# Patient Record
Sex: Female | Born: 1965 | Race: White | Hispanic: Yes | Marital: Married | Smoking: Never smoker
Health system: Southern US, Community
[De-identification: ages and names within clinical notes are randomized; demographics above are authoritative.]

## PROBLEM LIST (undated history)

## (undated) DIAGNOSIS — D649 Anemia, unspecified: Secondary | ICD-10-CM

## (undated) HISTORY — DX: Anemia, unspecified: D64.9

## (undated) HISTORY — PX: EYE SURGERY: SHX253

---

## 2014-02-26 ENCOUNTER — Ambulatory Visit (INDEPENDENT_AMBULATORY_CARE_PROVIDER_SITE_OTHER): Payer: BC Managed Care – PPO | Admitting: Family

## 2014-02-26 ENCOUNTER — Other Ambulatory Visit: Payer: Self-pay | Admitting: Family

## 2014-02-26 ENCOUNTER — Other Ambulatory Visit (HOSPITAL_COMMUNITY)
Admission: RE | Admit: 2014-02-26 | Discharge: 2014-02-26 | Disposition: A | Discharge status: Home / Self Care | Payer: BC Managed Care – PPO | Source: Ambulatory Visit | Attending: Family | Admitting: Family

## 2014-02-26 ENCOUNTER — Encounter (INDEPENDENT_AMBULATORY_CARE_PROVIDER_SITE_OTHER): Payer: Self-pay

## 2014-02-26 ENCOUNTER — Encounter: Payer: Self-pay | Admitting: Family

## 2014-02-26 VITALS — BP 100/78 | HR 81 | Temp 98.4°F | Resp 16 | Ht 61.25 in | Wt 121.1 lb

## 2014-02-26 DIAGNOSIS — Z87898 Personal history of other specified conditions: Secondary | ICD-10-CM

## 2014-02-26 DIAGNOSIS — M21619 Bunion of unspecified foot: Secondary | ICD-10-CM

## 2014-02-26 DIAGNOSIS — E28319 Asymptomatic premature menopause: Secondary | ICD-10-CM

## 2014-02-26 DIAGNOSIS — Z1151 Encounter for screening for human papillomavirus (HPV): Secondary | ICD-10-CM | POA: Insufficient documentation

## 2014-02-26 DIAGNOSIS — Z9189 Other specified personal risk factors, not elsewhere classified: Secondary | ICD-10-CM

## 2014-02-26 DIAGNOSIS — N63 Unspecified lump in unspecified breast: Secondary | ICD-10-CM

## 2014-02-26 DIAGNOSIS — M21612 Bunion of left foot: Secondary | ICD-10-CM

## 2014-02-26 DIAGNOSIS — Z01419 Encounter for gynecological examination (general) (routine) without abnormal findings: Secondary | ICD-10-CM | POA: Insufficient documentation

## 2014-02-26 DIAGNOSIS — M170 Bilateral primary osteoarthritis of knee: Secondary | ICD-10-CM | POA: Insufficient documentation

## 2014-02-26 DIAGNOSIS — M1710 Unilateral primary osteoarthritis, unspecified knee: Secondary | ICD-10-CM

## 2014-02-26 DIAGNOSIS — L989 Disorder of the skin and subcutaneous tissue, unspecified: Secondary | ICD-10-CM

## 2014-02-26 DIAGNOSIS — M21611 Bunion of right foot: Secondary | ICD-10-CM | POA: Insufficient documentation

## 2014-02-26 DIAGNOSIS — IMO0002 Reserved for concepts with insufficient information to code with codable children: Secondary | ICD-10-CM

## 2014-02-26 DIAGNOSIS — Z0000 Encounter for general adult medical examination without abnormal findings: Secondary | ICD-10-CM

## 2014-02-26 LAB — CBC WITH DIFFERENTIAL/PLATELET
Basophils Absolute: 0 10*3/uL (ref 0.0–0.1)
Basophils Relative: 0 % (ref 0–1)
EOS ABS: 0.1 10*3/uL (ref 0.0–0.7)
EOS PCT: 2 % (ref 0–5)
HEMATOCRIT: 41.9 % (ref 36.0–46.0)
Hemoglobin: 13.9 g/dL (ref 12.0–15.0)
LYMPHS ABS: 1.2 10*3/uL (ref 0.7–4.0)
LYMPHS PCT: 24 % (ref 12–46)
MCH: 29.1 pg (ref 26.0–34.0)
MCHC: 33.2 g/dL (ref 30.0–36.0)
MCV: 87.8 fL (ref 78.0–100.0)
MONO ABS: 0.3 10*3/uL (ref 0.1–1.0)
MONOS PCT: 6 % (ref 3–12)
Neutro Abs: 3.3 10*3/uL (ref 1.7–7.7)
Neutrophils Relative %: 68 % (ref 43–77)
Platelets: 210 10*3/uL (ref 150–400)
RBC: 4.77 MIL/uL (ref 3.87–5.11)
RDW: 13.1 % (ref 11.5–15.5)
WBC: 4.8 10*3/uL (ref 4.0–10.5)

## 2014-02-26 LAB — LIPID PANEL
Cholesterol: 155 mg/dL (ref 0–200)
HDL: 60 mg/dL (ref >39)
LDL Cholesterol: 72 mg/dL (ref 0–99)
Total CHOL/HDL Ratio: 2.6 Ratio
Triglycerides: 115 mg/dL (ref <150)
VLDL: 23 mg/dL (ref 0–40)

## 2014-02-26 LAB — BASIC METABOLIC PANEL WITH GFR
BUN: 12 mg/dL (ref 6–23)
CALCIUM: 9.6 mg/dL (ref 8.4–10.5)
CO2: 32 meq/L (ref 19–32)
Chloride: 104 mEq/L (ref 96–112)
Creat: 0.63 mg/dL (ref 0.50–1.10)
GFR, Est African American: >89 mL/min
GFR, Est Non African American: >89 mL/min
GLUCOSE: 88 mg/dL (ref 70–99)
Potassium: 5.2 mEq/L (ref 3.5–5.3)
SODIUM: 141 meq/L (ref 135–145)

## 2014-02-26 LAB — HEPATIC FUNCTION PANEL
ALBUMIN: 4.3 g/dL (ref 3.5–5.2)
ALK PHOS: 93 U/L (ref 39–117)
ALT: 27 U/L (ref 0–35)
AST: 30 U/L (ref 0–37)
BILIRUBIN INDIRECT: 0.3 mg/dL (ref 0.2–1.2)
Bilirubin, Direct: 0.1 mg/dL (ref 0.0–0.3)
TOTAL PROTEIN: 7.2 g/dL (ref 6.0–8.3)
Total Bilirubin: 0.4 mg/dL (ref 0.2–1.2)

## 2014-02-26 LAB — TSH: TSH: 1.7 u[IU]/mL (ref 0.350–4.500)

## 2014-02-26 NOTE — Assessment & Plan Note (Signed)
Recommended prn tylenol, regular exercise.

## 2014-02-26 NOTE — Assessment & Plan Note (Signed)
She would like to be referred to podiatry for possible surgical correction.

## 2014-02-26 NOTE — Progress Notes (Signed)
Pre visit review using our clinic review tool, if applicable. No additional management support is needed unless otherwise documented below in the visit note. 

## 2014-02-26 NOTE — Patient Instructions (Signed)
Please complete your lab work prior to leaving. Schedule bone density at the front desk. You will be contacted re: mammogram, podiatry and dermatology referrals. Follow up in 1 year, sooner if problems/concerns.

## 2014-02-26 NOTE — Assessment & Plan Note (Signed)
Will refer to dermatology for excision.  

## 2014-02-26 NOTE — Assessment & Plan Note (Signed)
No palpable mass today, but she never followed through with the 6 month follow up breast US back in 2012. Will refer for diagnostic mammo and breast US.

## 2014-02-26 NOTE — Assessment & Plan Note (Signed)
Will add caltrate for bone health, obtain vitamin D level.

## 2014-02-26 NOTE — Assessment & Plan Note (Addendum)
Discussed healthy diet, exercise.  Tdap up to date. Pap performed today with chaperone. Obtain fasting lab work.  Obtain dexa scan due to hx early menopause.

## 2014-02-26 NOTE — Progress Notes (Signed)
Subjective:    Patient ID: Diana Norris, female    DOB: 05/15/1966, 48 y.o.   MRN: [id number]  HPI  Diana Norris is a 48 yr old female who presents today to establish care.    Immunizations: tetanus up to date, declines flu shot.  Diet: Working on a healthier diet Exercise: dancing, roller skating Pap Smear:2/12 Mammogram: due- Abnormal breast ultrasound- had breast ultrasound 2012 and it was recommended that it be repeated in 6 months. She never completed follow up breast ultrasound.   Anemia- hx of reports LMP 7/11  R knee pain- some aching both knees at night.  Worse with sitting or bending.    Reports occasional discomfort right buttock, cramping pain.    Skin lesion right cheek and left upper arm  [place]- would like referral to podiatry   Review of Systems  Constitutional: Negative for unexpected weight change.  HENT: Negative for hearing loss.   Eyes:       Wears glasses for driving  Respiratory: Negative for cough.   Cardiovascular: Negative for leg swelling.  Gastrointestinal: Negative for vomiting and diarrhea.       Occasional nausea which she attributes to menopause  Genitourinary: Negative for dysuria and frequency.  Musculoskeletal:       Bilateral knee pain  Skin: Negative for rash.   Past Medical History  Diagnosis Date  . Anemia     History   Social History  . Marital Status: Married    Spouse Name: N/A    Number of Children: N/A  . Years of Education: N/A   Occupational History  . Not on file.   Social History Main Topics  . Smoking status: Never Smoker   . Smokeless tobacco: Never Used  . Alcohol Use: No  . Drug Use: Not on file  . Sexual Activity: Not on file   Other Topics Concern  . Not on file   Social History Narrative   Married   Has bachelor degree, works in Press photographer.     3 children 74 son, 45 and 61 daughters   Enjoys dancing, spending time with kids    Past Surgical History  Procedure Laterality Date  . Cesarean  section      x 2    Family History  Problem Relation Age of Onset  . Hypertension Mother   . Hypertension Father   . Diabetes Father   . Hyperlipidemia Father   . Cancer Maternal Grandmother     breast?  . Heart disease Neg Hx   . Kidney disease Neg Hx     No Known Allergies  No current outpatient prescriptions on file prior to visit.   No current facility-administered medications on file prior to visit.    BP 100/78  Pulse 81  Temp(Src) 98.4 F (36.9 C) (Oral)  Resp 16  Ht 5' 1.25" (1.556 m)  Wt 121 lb 1.9 oz (54.94 kg)  BMI 22.69 kg/m2  SpO2 99%  LMP 06/26/2010       Objective:   Physical Exam  Physical Exam  Constitutional: She is oriented to person, place, and time. She appears well-developed and well-nourished. No distress.  HENT:  Head: Normocephalic and atraumatic.  Right Ear: Tympanic membrane and ear canal normal.  Left Ear: Tympanic membrane and ear canal normal.  Mouth/Throat: Oropharynx is clear and moist.  Eyes: Pupils are equal, round, and reactive to light. No scleral icterus.  Neck: Normal range of motion. No thyromegaly present.  Cardiovascular: Normal rate and  regular rhythm.   No murmur heard. Pulmonary/Chest: Effort normal and breath sounds normal. No respiratory distress. He has no wheezes. She has no rales. She exhibits no tenderness.  Abdominal: Soft. Bowel sounds are normal. He exhibits no distension and no mass. There is no tenderness. There is no rebound and no guarding.  Musculoskeletal: She exhibits no edema. no knee pain or crepitus is noted Lymphadenopathy:    She has no cervical adenopathy.  Neurological: She is alert and oriented to person, place, and time. She has normal reflexes. She exhibits normal muscle tone. Coordination normal.  Skin: Skin is warm and dry. small, cutaneous nodule noted left upper arm and left forearm Psychiatric: She has a normal mood and affect. Her behavior is normal. Judgment and thought content  normal.  Breasts: Examined lying Right: Without masses, retractions, discharge or axillary adenopathy.  Left: Without masses, retractions, discharge or axillary adenopathy.  Inguinal/mons: Normal without inguinal adenopathy  External genitalia: Normal  BUS/Urethra/Skene's glands: Normal  Bladder: Normal  Vagina: Normal  Cervix: Normal  Uterus: normal in size, shape and contour. Midline and mobile  Adnexa/parametria:  Rt: Without masses or tenderness.  Lt: Without masses or tenderness.  Anus and perineum: Normal           Assessment & Plan:          Assessment & Plan:

## 2014-02-27 ENCOUNTER — Telehealth: Payer: Self-pay | Admitting: Family

## 2014-02-27 DIAGNOSIS — E559 Vitamin D deficiency, unspecified: Secondary | ICD-10-CM

## 2014-02-27 LAB — URINALYSIS, ROUTINE W REFLEX MICROSCOPIC
Bilirubin Urine: NEGATIVE
Glucose, UA: NEGATIVE mg/dL
Hgb urine dipstick: NEGATIVE
Ketones, ur: NEGATIVE mg/dL
LEUKOCYTES UA: NEGATIVE
Nitrite: NEGATIVE
Protein, ur: NEGATIVE mg/dL
SPECIFIC GRAVITY, URINE: 1.018 (ref 1.005–1.030)
UROBILINOGEN UA: 0.2 mg/dL (ref 0.0–1.0)
pH: 7 (ref 5.0–8.0)

## 2014-02-27 LAB — VITAMIN D 25 HYDROXY (VIT D DEFICIENCY, FRACTURES): VIT D 25 HYDROXY: 16 ng/mL — AB (ref 30–89)

## 2014-02-27 MED ORDER — VITAMIN D (ERGOCALCIFEROL) 1.25 MG (50000 UNIT) PO CAPS: 50000.0000 [IU] | ORAL_CAPSULE | ORAL | Status: DC

## 2014-02-27 NOTE — Telephone Encounter (Signed)
Please call pt and let her know that her blood work all looks good except for vitamin D level which is very low.  I would like her to start the caltrate with D as we discussed at her visit, and also to start vit D 50000 units once weekly x 12 weeks, then recheck level.

## 2014-02-27 NOTE — Telephone Encounter (Signed)
Pt is wanting to know if her estrogen was checked with lab work

## 2014-02-27 NOTE — Telephone Encounter (Signed)
Notified pt that we did not check estrogen level. She requests to have this added to previous labs due to her history of amenorrhea x 4 years.  PLease advise if ok to add test?

## 2014-02-27 NOTE — Telephone Encounter (Signed)
Notified pt and she voices understanding. Lab order entered and copy mailed to pt as a reminder.

## 2014-02-28 NOTE — Telephone Encounter (Signed)
OK to add test, but I expect this to be low as she is menopausal. Please let pt know that the results will not affect our management as she does not wish to do estrogen replacement.  I am happy to check though if she would like to know her level.

## 2014-03-01 NOTE — Telephone Encounter (Signed)
Test has been added per [facility]. 

## 2014-03-02 ENCOUNTER — Encounter: Payer: Self-pay | Admitting: Family

## 2014-03-04 LAB — ESTROGENS, TOTAL: ESTROGEN: 73.6 pg/mL

## 2014-03-06 ENCOUNTER — Telehealth: Payer: Self-pay | Admitting: *Deleted

## 2014-03-06 DIAGNOSIS — N912 Amenorrhea, unspecified: Secondary | ICD-10-CM

## 2014-03-06 NOTE — Telephone Encounter (Signed)
Notified pt and she is agreeable to proceed with additional tests.  Lab order entered.

## 2014-03-06 NOTE — Telephone Encounter (Signed)
Message copied by Ronny Flurry on Wed Mar 06, 2014  4:40 PM ------      Message from: O'SULLIVAN, MELISSA      Created: Tue Mar 05, 2014  7:43 AM       Please let pt know that her estrogen level actually looks good and is not in the menopausal range.  I would like her to complete [facility] and [facility] to further evaluate her menopausal status. Dx amenorrhea. ------

## 2014-03-11 ENCOUNTER — Other Ambulatory Visit: Payer: BC Managed Care – PPO

## 2014-03-12 ENCOUNTER — Ambulatory Visit
Admission: RE | Admit: 2014-03-12 | Discharge: 2014-03-12 | Disposition: A | Discharge status: Home / Self Care | Payer: BC Managed Care – PPO | Source: Ambulatory Visit | Attending: Family | Admitting: Family

## 2014-03-12 DIAGNOSIS — N63 Unspecified lump in unspecified breast: Secondary | ICD-10-CM

## 2015-02-27 ENCOUNTER — Telehealth: Payer: Self-pay

## 2015-02-27 NOTE — Telephone Encounter (Signed)
Medication: Review, verify sig & reconcile(including outside meds):  yes Duplicates discarded: n/a DM supply source: n/a  Preferred Pharmacy and which med where:  [id number] - [place], [place] - [address] AT [place] 90 day supply/mail order: n/a Local pharmacy: [id number] - [place], [place] - [address] AT [place]   Allergies verified: yes  Immunization Status: Prompted for insurance verification: n/a  Flu vaccine-- declined Tdap--08/17/11  A/P:   Changes to [place], PSH or Personal Hx: Reviewed. No changes. Pap--02/26/14- normal MMG-- 03/12/14- benign findings  Care Teams Updated: ED/Hospital/Urgent Care Visits: No Prompted for: Updated insurance, contact information, forms:  yes Remind to bring: DPR information, advance directives:  yes  To Discuss with Provider: Nothing at this time.

## 2015-03-03 ENCOUNTER — Encounter: Payer: Self-pay | Admitting: Family

## 2015-03-03 ENCOUNTER — Ambulatory Visit (INDEPENDENT_AMBULATORY_CARE_PROVIDER_SITE_OTHER): Payer: BLUE CROSS/BLUE SHIELD | Admitting: Family

## 2015-03-03 VITALS — BP 86/66 | HR 71 | Temp 98.0°F | Resp 16 | Ht 61.25 in | Wt 123.6 lb

## 2015-03-03 DIAGNOSIS — E559 Vitamin D deficiency, unspecified: Secondary | ICD-10-CM

## 2015-03-03 DIAGNOSIS — Z0000 Encounter for general adult medical examination without abnormal findings: Secondary | ICD-10-CM

## 2015-03-03 DIAGNOSIS — L989 Disorder of the skin and subcutaneous tissue, unspecified: Secondary | ICD-10-CM

## 2015-03-03 LAB — HEPATIC FUNCTION PANEL
ALBUMIN: 4.3 g/dL (ref 3.5–5.2)
ALT: 19 U/L (ref 0–35)
AST: 20 U/L (ref 0–37)
Alkaline Phosphatase: 102 U/L (ref 39–117)
BILIRUBIN DIRECT: 0.1 mg/dL (ref 0.0–0.3)
TOTAL PROTEIN: 7.5 g/dL (ref 6.0–8.3)
Total Bilirubin: 0.5 mg/dL (ref 0.2–1.2)

## 2015-03-03 LAB — LIPID PANEL
CHOL/HDL RATIO: 2
CHOLESTEROL: 143 mg/dL (ref 0–200)
HDL: 58.4 mg/dL (ref >39.00)
LDL CALC: 59 mg/dL (ref 0–99)
NonHDL: 84.6
Triglycerides: 128 mg/dL (ref 0.0–149.0)
VLDL: 25.6 mg/dL (ref 0.0–40.0)

## 2015-03-03 LAB — CBC WITH DIFFERENTIAL/PLATELET
BASOS ABS: 0 10*3/uL (ref 0.0–0.1)
Basophils Relative: 0.7 % (ref 0.0–3.0)
Eosinophils Absolute: 0.1 10*3/uL (ref 0.0–0.7)
Eosinophils Relative: 1.4 % (ref 0.0–5.0)
HCT: 40.7 % (ref 36.0–46.0)
Hemoglobin: 13.6 g/dL (ref 12.0–15.0)
Lymphocytes Relative: 22.3 % (ref 12.0–46.0)
Lymphs Abs: 1 10*3/uL (ref 0.7–4.0)
MCHC: 33.4 g/dL (ref 30.0–36.0)
MCV: 87.3 fl (ref 78.0–100.0)
MONO ABS: 0.3 10*3/uL (ref 0.1–1.0)
Monocytes Relative: 5.9 % (ref 3.0–12.0)
Neutro Abs: 3.1 10*3/uL (ref 1.4–7.7)
Neutrophils Relative %: 69.7 % (ref 43.0–77.0)
PLATELETS: 228 10*3/uL (ref 150.0–400.0)
RBC: 4.66 Mil/uL (ref 3.87–5.11)
RDW: 12.7 % (ref 11.5–15.5)
WBC: 4.5 10*3/uL (ref 4.0–10.5)

## 2015-03-03 LAB — BASIC METABOLIC PANEL
BUN: 11 mg/dL (ref 6–23)
CHLORIDE: 104 meq/L (ref 96–112)
CO2: 31 meq/L (ref 19–32)
Calcium: 9.5 mg/dL (ref 8.4–10.5)
Creatinine, Ser: 0.66 mg/dL (ref 0.40–1.20)
GFR: 101.7 mL/min (ref >60.00)
Glucose, Bld: 87 mg/dL (ref 70–99)
Potassium: 4.6 mEq/L (ref 3.5–5.1)
Sodium: 139 mEq/L (ref 135–145)

## 2015-03-03 LAB — URINALYSIS, ROUTINE W REFLEX MICROSCOPIC
Bilirubin Urine: NEGATIVE
Hgb urine dipstick: NEGATIVE
Ketones, ur: NEGATIVE
LEUKOCYTES UA: NEGATIVE
Nitrite: NEGATIVE
PH: 7 (ref 5.0–8.0)
SPECIFIC GRAVITY, URINE: 1.015 (ref 1.000–1.030)
Total Protein, Urine: NEGATIVE
URINE GLUCOSE: NEGATIVE
UROBILINOGEN UA: 0.2 (ref 0.0–1.0)

## 2015-03-03 LAB — TSH: TSH: 1.43 u[IU]/mL (ref 0.35–4.50)

## 2015-03-03 LAB — VITAMIN D 25 HYDROXY (VIT D DEFICIENCY, FRACTURES): VITD: 12.72 ng/mL — ABNORMAL LOW (ref 30.00–100.00)

## 2015-03-03 NOTE — Patient Instructions (Addendum)
Please complete lab work prior to leaving. You will be contacted about scheduling your mammogram.  Please check your records re: bone density and let us know when/where this was performed.  Please follow up in 1 year.  Follow up sooner if problems or concerns.

## 2015-03-03 NOTE — Progress Notes (Signed)
Subjective:    Patient ID: Diana Norris, female    DOB: 08/25/1966, 49 y.o.   MRN: [id number]  HPI   Diana Norris is a 49 yr old female who presents today for cpx.  Patient presents today for complete physical.  Immunizations: tdap up to date Diet: healthy Exercise: was walking.  Pap Smear: negative last year. Mammogram: due     Review of Systems  Constitutional: Negative for fever, fatigue and unexpected weight change.  HENT: Positive for rhinorrhea. Negative for hearing loss.   Eyes: Negative for visual disturbance.  Respiratory: Negative for cough.   Cardiovascular: Negative for palpitations and leg swelling.  Gastrointestinal: Negative for nausea, abdominal pain, diarrhea and constipation.  Genitourinary: Negative for dysuria and frequency.  Musculoskeletal: Negative for myalgias and arthralgias.       Some musculoskeletal pain following cough, occasional left knee pain  Skin: Negative for rash.       Nodule left elbow, skin tag right ear  Neurological: Negative for headaches.       Some eye strain due to being on computer  Hematological: Negative for adenopathy.  Psychiatric/Behavioral: Negative for dysphoric mood and agitation.   Past Medical History  Diagnosis Date  . Anemia     History   Social History  . Marital Status: Married    Spouse Name: N/A  . Number of Children: N/A  . Years of Education: N/A   Occupational History  . Not on file.   Social History Main Topics  . Smoking status: Never Smoker   . Smokeless tobacco: Never Used  . Alcohol Use: No  . Drug Use: Not on file  . Sexual Activity: Not on file   Other Topics Concern  . Not on file   Social History Narrative   Married   Has bachelor degree, works in Press photographer.     3 children 64 son, 81 and 5 daughters   Enjoys dancing, spending time with kids    Past Surgical History  Procedure Laterality Date  . Cesarean section      x 2    Family History  Problem Relation Age of  Onset  . Hypertension Mother   . Hypertension Father   . Diabetes Father   . Hyperlipidemia Father   . Cancer Maternal Grandmother     breast?  . Heart disease Neg Hx   . Kidney disease Neg Hx     No Known Allergies  Current Outpatient Prescriptions on File Prior to Visit  Medication Sig Dispense Refill  . Calcium Carbonate-Vitamin D (CALTRATE 600+D) 600-400 MG-UNIT per tablet Take 2 tablets by mouth daily.     No current facility-administered medications on file prior to visit.    BP 86/66 mmHg  Pulse 71  Temp(Src) 98 F (36.7 C) (Oral)  Resp 16  Ht 5' 1.25" (1.556 m)  Wt 123 lb 9.6 oz (56.065 kg)  BMI 23.16 kg/m2  SpO2 99%  LMP 06/26/2010       Objective:   Physical Exam  Physical Exam  Constitutional: She is oriented to person, place, and time. She appears well-developed and well-nourished. No distress.  HENT:  Head: Normocephalic and atraumatic.  Right Ear: Tympanic membrane and ear canal normal.  Left Ear: Tympanic membrane and ear canal normal.  Mouth/Throat: Oropharynx is clear and moist.  Eyes: Pupils are equal, round, and reactive to light. No scleral icterus.  Neck: Normal range of motion. No thyromegaly present.  Cardiovascular: Normal rate and regular rhythm.  No murmur heard. Pulmonary/Chest: Effort normal and breath sounds normal. No respiratory distress. He has no wheezes. She has no rales. She exhibits no tenderness.  Abdominal: Soft. Bowel sounds are normal. He exhibits no distension and no mass. There is no tenderness. There is no rebound and no guarding.  Musculoskeletal: She exhibits no edema.  Lymphadenopathy:    She has no cervical adenopathy.  Neurological: She is alert and oriented to person, place, and time. She has normal patellar reflexes. She exhibits normal muscle tone. Coordination normal.  Skin: Skin is warm and dry. Right pre-auricular skin lesion and nodule left upper inner arm.  Psychiatric: She has a normal mood and affect.  Her behavior is normal. Judgment and thought content normal.  Breasts: Examined lying Right: Without masses, discharge or axillary adenopathy. Slight retraction of right areola 9 oclock (normal per pt) Left: Without masses, retractions, discharge or axillary adenopathy.  Inguinal/mons: Normal without inguinal adenopathy           Assessment & Plan:         Assessment & Plan:  Skin lesions- refer to derm for removal  Vit D deficiency- obtain level  Preventative health care- reinforced importance of exercise. Continue healthy diet, obtain routine mammogram. Dexa. (no insurance records of completion)

## 2015-03-04 ENCOUNTER — Other Ambulatory Visit: Payer: Self-pay | Admitting: Family

## 2015-03-04 DIAGNOSIS — E559 Vitamin D deficiency, unspecified: Secondary | ICD-10-CM

## 2015-03-04 NOTE — Telephone Encounter (Signed)
Vit d is low, start weekly 50000 vit d, repeat level in 3 months please. Other labs look good.

## 2015-03-05 IMAGING — MG MM DIAGNOSTIC BILATERAL
4 series · 4 of 4 positions shown · non-contrast
Comparison: January 29, 2011

CLINICAL DATA: Patient had a palpable lump left breast 6356 for
which a six-month followup is recommended. The patient states she
has no palpable lumps currently.

EXAM:
DIGITAL DIAGNOSTIC  bilateral MAMMOGRAM WITH CAD

[R CC]
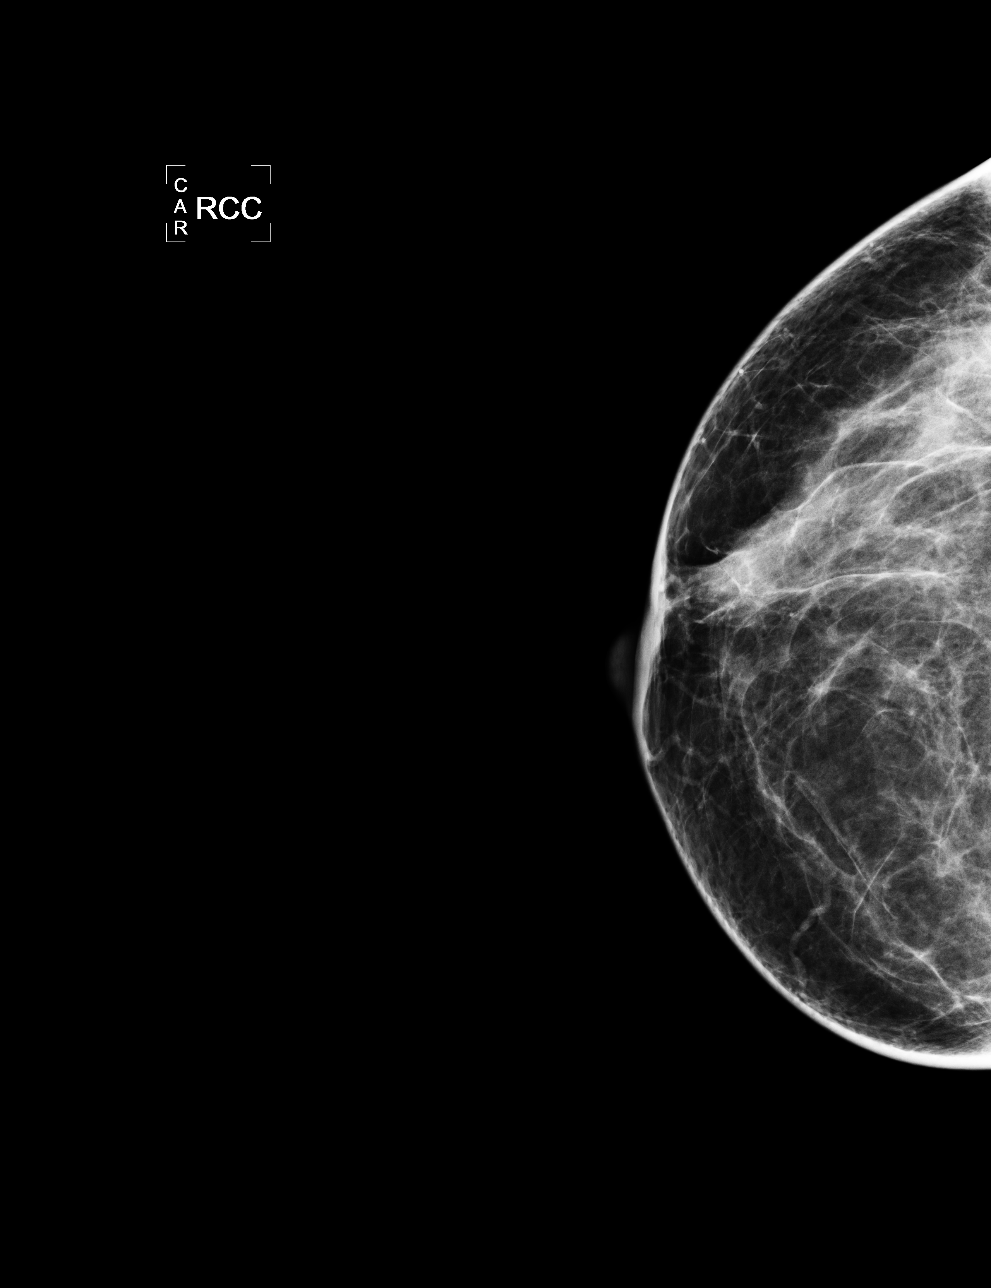

[L CC]
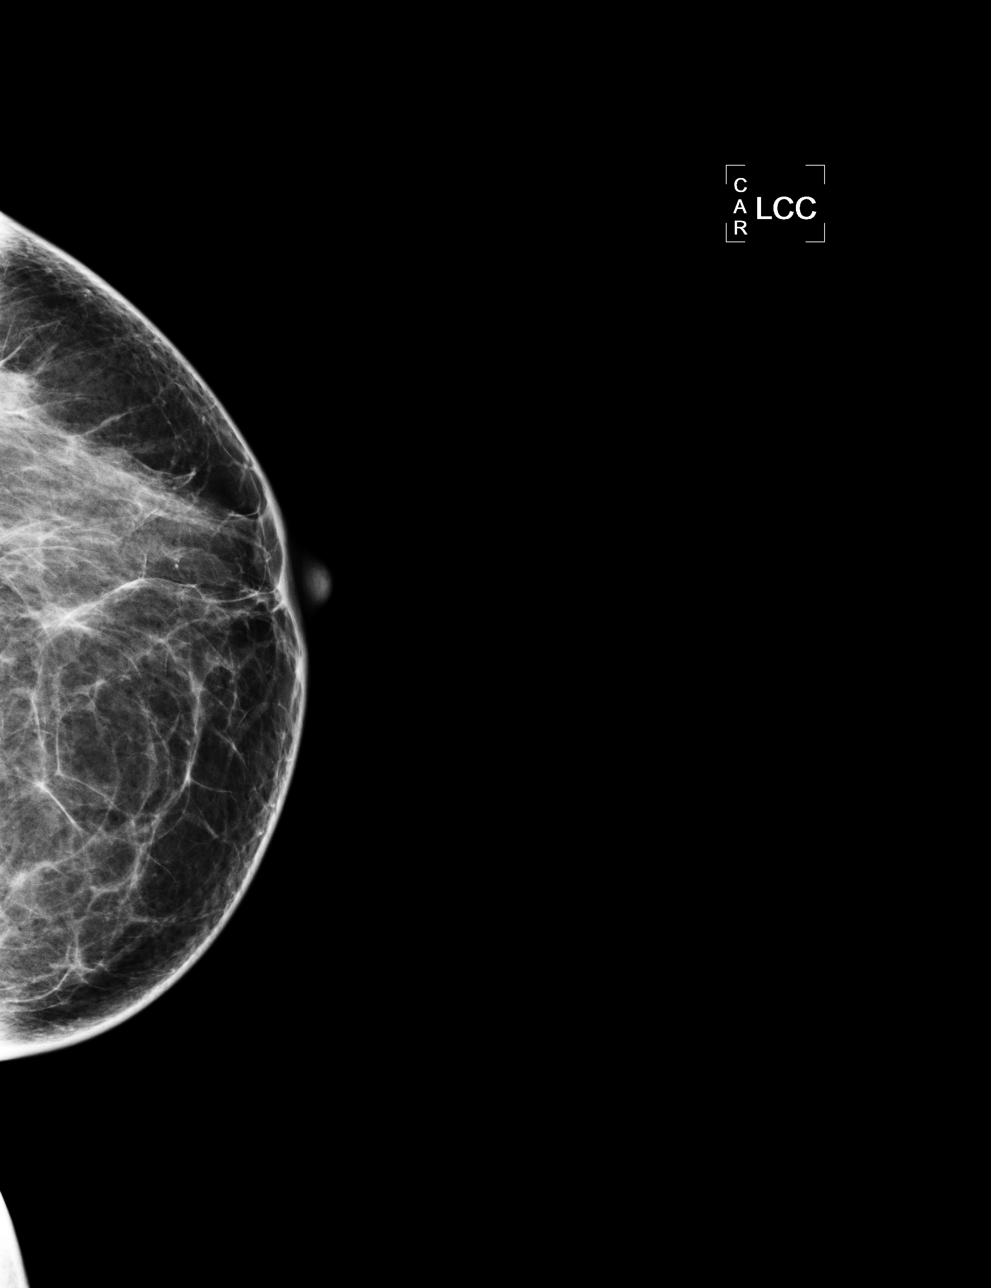

[L MLO]
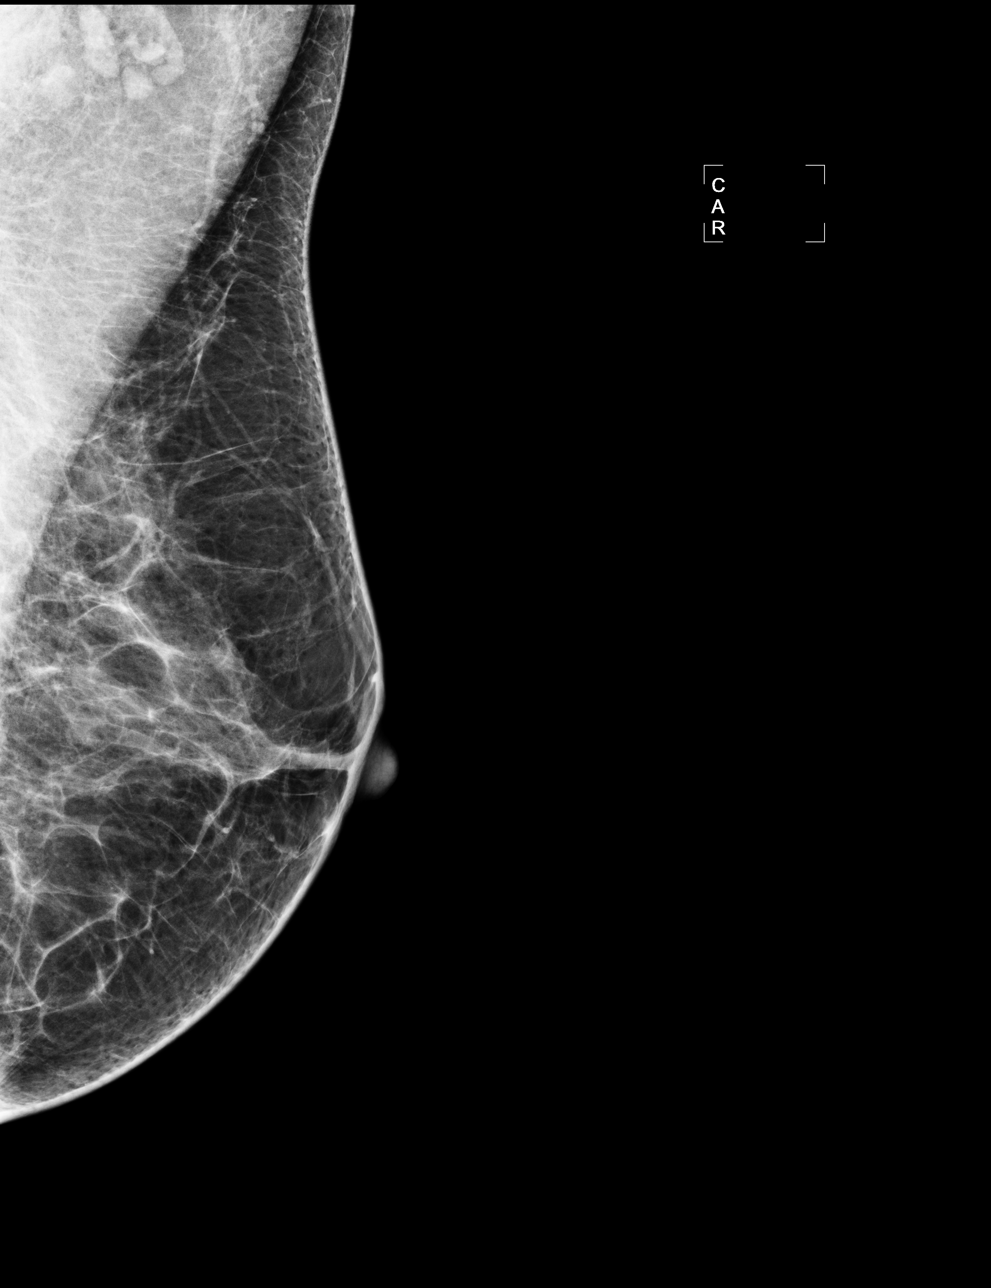

[R MLO]
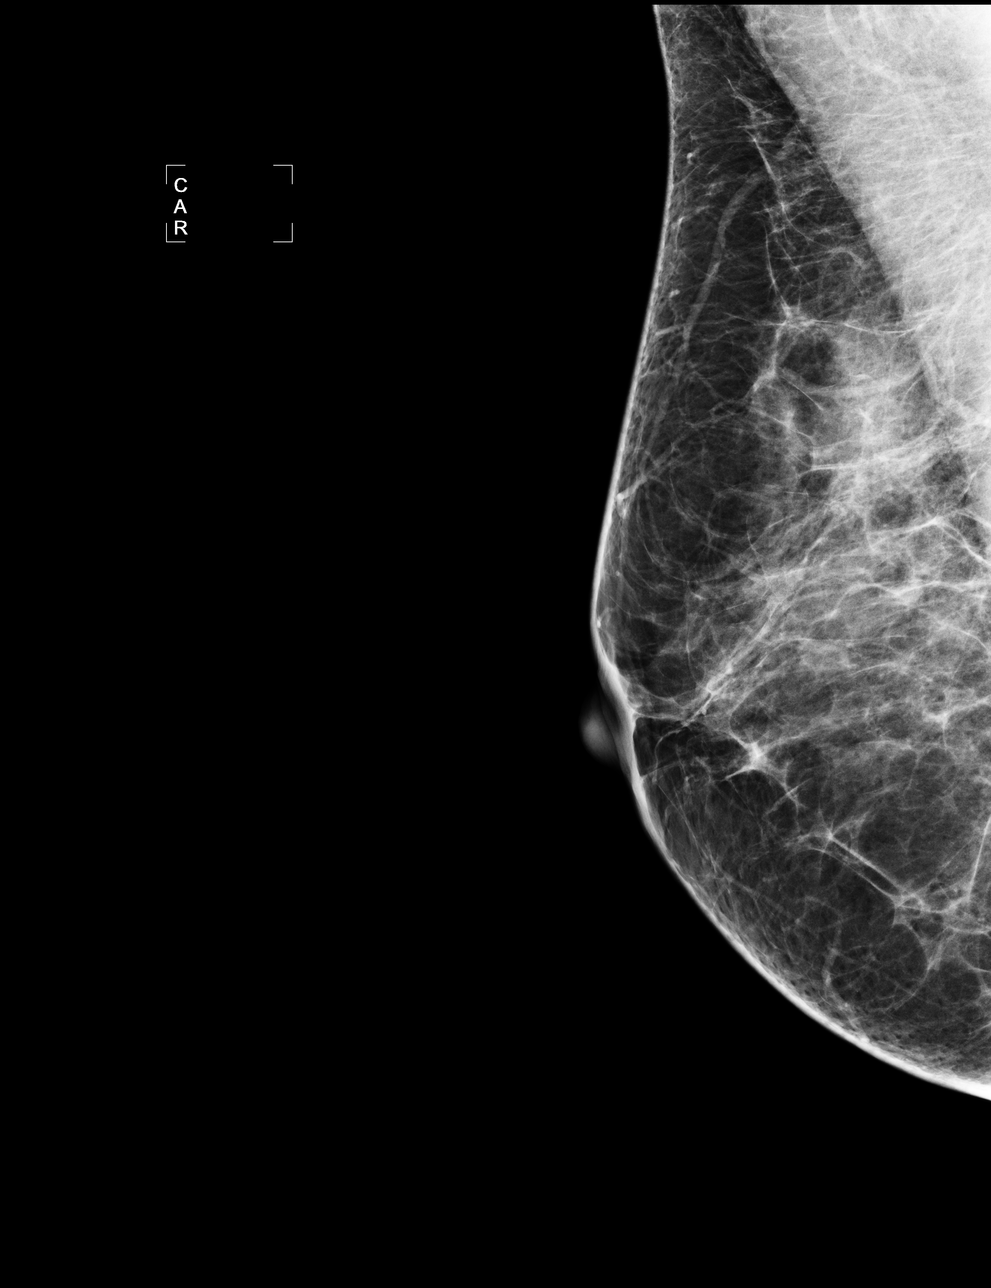

[4 of 4 positions shown; findings below may reference images not displayed]

ACR Breast Density Category b: There are scattered areas of
fibroglandular density.
FINDINGS: Cc and MLO views of bilateral breasts are submitted. No suspicious
abnormalities identified bilaterally.

Mammographic images were processed with CAD.
IMPRESSION: Benign findings.

RECOMMENDATION:
Routine screening mammogram in 1 year.

I have discussed the findings and recommendations with the patient.
Results were also provided in writing at the conclusion of the
visit. If applicable, a reminder letter will be sent to the patient
regarding the next appointment.

BI-RADS CATEGORY  2: Benign finding(s).

## 2015-03-05 MED ORDER — VITAMIN D (ERGOCALCIFEROL) 1.25 MG (50000 UNIT) PO CAPS: 50000.0000 [IU] | ORAL_CAPSULE | ORAL | Status: DC

## 2015-03-05 NOTE — Telephone Encounter (Signed)
Rx sent, future lab order entered and lab apt scheduled for 06/10/15 at 8am.

## 2015-04-28 ENCOUNTER — Telehealth: Payer: Self-pay | Admitting: Family

## 2015-04-28 DIAGNOSIS — E2839 Other primary ovarian failure: Secondary | ICD-10-CM

## 2015-04-28 NOTE — Telephone Encounter (Signed)
Could you please contact pt and schedule bone density? thanks

## 2015-04-29 NOTE — Telephone Encounter (Signed)
Order placed

## 2015-04-29 NOTE — Telephone Encounter (Signed)
They is no order, can you please place.

## 2015-05-06 ENCOUNTER — Ambulatory Visit (INDEPENDENT_AMBULATORY_CARE_PROVIDER_SITE_OTHER)
Admission: RE | Admit: 2015-05-06 | Discharge: 2015-05-06 | Disposition: A | Discharge status: Home / Self Care | Payer: BLUE CROSS/BLUE SHIELD | Source: Ambulatory Visit | Attending: Family | Admitting: Family

## 2015-05-06 DIAGNOSIS — E2839 Other primary ovarian failure: Secondary | ICD-10-CM | POA: Diagnosis not present

## 2015-05-08 ENCOUNTER — Encounter: Payer: Self-pay | Admitting: Family

## 2015-05-08 DIAGNOSIS — M8580 Other specified disorders of bone density and structure, unspecified site: Secondary | ICD-10-CM | POA: Insufficient documentation

## 2015-06-10 ENCOUNTER — Other Ambulatory Visit: Payer: BLUE CROSS/BLUE SHIELD

## 2016-03-02 ENCOUNTER — Telehealth: Payer: Self-pay

## 2016-03-03 ENCOUNTER — Encounter: Payer: BLUE CROSS/BLUE SHIELD | Admitting: Family

## 2016-03-08 NOTE — Telephone Encounter (Signed)
Pre visit call completed 

## 2016-04-22 ENCOUNTER — Emergency Department (HOSPITAL_BASED_OUTPATIENT_CLINIC_OR_DEPARTMENT_OTHER): Payer: BLUE CROSS/BLUE SHIELD

## 2016-04-22 ENCOUNTER — Encounter: Payer: Self-pay | Admitting: Medical

## 2016-04-22 ENCOUNTER — Emergency Department (HOSPITAL_BASED_OUTPATIENT_CLINIC_OR_DEPARTMENT_OTHER)
Admission: EM | Admit: 2016-04-22 | Discharge: 2016-04-22 | Disposition: A | Discharge status: Home / Self Care | Payer: BLUE CROSS/BLUE SHIELD | Attending: Emergency Medicine | Admitting: Emergency Medicine

## 2016-04-22 ENCOUNTER — Encounter (HOSPITAL_BASED_OUTPATIENT_CLINIC_OR_DEPARTMENT_OTHER): Payer: Self-pay | Admitting: *Deleted

## 2016-04-22 ENCOUNTER — Ambulatory Visit (INDEPENDENT_AMBULATORY_CARE_PROVIDER_SITE_OTHER): Payer: BLUE CROSS/BLUE SHIELD | Admitting: Medical

## 2016-04-22 VITALS — BP 80/62 | HR 62 | Temp 98.1°F | Ht 61.25 in | Wt 123.0 lb

## 2016-04-22 DIAGNOSIS — N951 Menopausal and female climacteric states: Secondary | ICD-10-CM | POA: Diagnosis not present

## 2016-04-22 DIAGNOSIS — R079 Chest pain, unspecified: Secondary | ICD-10-CM

## 2016-04-22 DIAGNOSIS — R0789 Other chest pain: Secondary | ICD-10-CM | POA: Insufficient documentation

## 2016-04-22 DIAGNOSIS — R42 Dizziness and giddiness: Secondary | ICD-10-CM | POA: Diagnosis not present

## 2016-04-22 DIAGNOSIS — R232 Flushing: Secondary | ICD-10-CM

## 2016-04-22 DIAGNOSIS — R5383 Other fatigue: Secondary | ICD-10-CM

## 2016-04-22 LAB — COMPREHENSIVE METABOLIC PANEL
ALT: 26 U/L (ref 14–54)
AST: 28 U/L (ref 15–41)
Albumin: 4.2 g/dL (ref 3.5–5.0)
Alkaline Phosphatase: 82 U/L (ref 38–126)
Anion gap: 3 — ABNORMAL LOW (ref 5–15)
BUN: 13 mg/dL (ref 6–20)
CALCIUM: 9.1 mg/dL (ref 8.9–10.3)
CO2: 28 mmol/L (ref 22–32)
CREATININE: 0.66 mg/dL (ref 0.44–1.00)
Chloride: 105 mmol/L (ref 101–111)
GFR calc Af Amer: >60 mL/min (ref >60)
GFR calc non Af Amer: >60 mL/min (ref >60)
Glucose, Bld: 93 mg/dL (ref 65–99)
Potassium: 3.9 mmol/L (ref 3.5–5.1)
SODIUM: 136 mmol/L (ref 135–145)
Total Bilirubin: 0.5 mg/dL (ref 0.3–1.2)
Total Protein: 7.2 g/dL (ref 6.5–8.1)

## 2016-04-22 LAB — CBC WITH DIFFERENTIAL/PLATELET
Basophils Absolute: 0 10*3/uL (ref 0.0–0.1)
Basophils Relative: 0 %
EOS ABS: 0 10*3/uL (ref 0.0–0.7)
Eosinophils Relative: 1 %
HCT: 43.1 % (ref 36.0–46.0)
Hemoglobin: 14.4 g/dL (ref 12.0–15.0)
Lymphocytes Relative: 21 %
Lymphs Abs: 1.2 10*3/uL (ref 0.7–4.0)
MCH: 29.6 pg (ref 26.0–34.0)
MCHC: 33.4 g/dL (ref 30.0–36.0)
MCV: 88.5 fL (ref 78.0–100.0)
Monocytes Absolute: 0.3 10*3/uL (ref 0.1–1.0)
Monocytes Relative: 6 %
NEUTROS PCT: 72 %
Neutro Abs: 4 10*3/uL (ref 1.7–7.7)
Platelets: 195 10*3/uL (ref 150–400)
RBC: 4.87 MIL/uL (ref 3.87–5.11)
RDW: 12.4 % (ref 11.5–15.5)
WBC: 5.5 10*3/uL (ref 4.0–10.5)

## 2016-04-22 LAB — BRAIN NATRIURETIC PEPTIDE: B Natriuretic Peptide: 13 pg/mL (ref 0.0–100.0)

## 2016-04-22 LAB — TROPONIN I
Troponin I: <0.03 ng/mL (ref <0.031)
Troponin I: <0.03 ng/mL (ref <0.031)

## 2016-04-22 LAB — D-DIMER, QUANTITATIVE: D-Dimer, Quant: <0.27 ug/mL-FEU (ref 0.00–0.50)

## 2016-04-22 MED ORDER — ASPIRIN 81 MG PO CHEW
324.0000 mg | CHEWABLE_TABLET | Freq: Once | ORAL | Status: AC
  Administered 2016-04-22: 324 mg via ORAL
  Filled 2016-04-22: qty 4

## 2016-04-22 MED ORDER — PANTOPRAZOLE SODIUM 20 MG PO TBEC: 40.0000 mg | DELAYED_RELEASE_TABLET | Freq: Every day | ORAL | Status: DC

## 2016-04-22 MED ORDER — NITROGLYCERIN 0.4 MG SL SUBL: 0.4000 mg | SUBLINGUAL_TABLET | SUBLINGUAL | Status: DC | PRN

## 2016-04-22 MED FILL — PANTOPRAZOLE SOD DR 20 MG T: 20 | 30 days supply | Qty: 60 | Fill #0

## 2016-04-22 NOTE — Progress Notes (Addendum)
Subjective:    Patient ID: Diana Norris, female    DOB: Apr 16, 1966, 50 y.o.   MRN: [id number]  HPI  Pt is in reporting she has been feeling intermittently dizzy and fatigue. She states years of feeling intermittent dizziness. This is not new. No gross motor or sensory function deficits. She states at times feels like may pass out but has not.She associated dizziness with hot flashes.  Pt states she some history of recent hot flashes. She states with hot flashes. But her hormones test don't show menopause but she states told premenopause.   Pt thinks maybe she has some reflux after eating. She states for past month pain after eating. Recently using tums. No black appearance to stools.  Pt has some faint chest pressing sensation. Very mild sensation(some mid chest with a little radiation to left shoulder. This has been the case since this am. Mild nausea, no jaw pain. No arm pain. No abdomen pain. Also some intermittent chest pain over last couple of weeks. Pt faint pressure/pain sensation present since around 7 am. No diabetes, non smoker,not obese, and negative fh. No htn.     Review of Systems  Constitutional: Negative for fever, chills and fatigue.  HENT: Negative for congestion and ear pain.   Respiratory: Negative for chest tightness and shortness of breath.   Cardiovascular: Positive for chest pain. Negative for palpitations.  Gastrointestinal: Negative for nausea, abdominal pain, diarrhea and constipation.       See hpi. Occasional faint discomfort after eating.  Musculoskeletal: Negative for back pain.  Neurological: Negative for dizziness and headaches.       Occasional dizziness. She attributes and associates with hot flashes.  Hematological: Negative for adenopathy. Does not bruise/bleed easily.  Psychiatric/Behavioral: Negative for behavioral problems and confusion.    Past Medical History  Diagnosis Date  . Anemia      Social History   Social History  . Marital  Status: Married    Spouse Name: N/A  . Number of Children: N/A  . Years of Education: N/A   Occupational History  . Not on file.   Social History Main Topics  . Smoking status: Never Smoker   . Smokeless tobacco: Never Used  . Alcohol Use: No  . Drug Use: Not on file  . Sexual Activity: Not on file   Other Topics Concern  . Not on file   Social History Narrative   Married   Has bachelor degree, works in Press photographer.     3 children 52 son, 56 and 65 daughters   Enjoys dancing, spending time with kids    Past Surgical History  Procedure Laterality Date  . Cesarean section      x 2    Family History  Problem Relation Age of Onset  . Hypertension Mother   . Hypertension Father   . Diabetes Father   . Hyperlipidemia Father   . Cancer Maternal Grandmother     breast?  . Heart disease Neg Hx   . Kidney disease Neg Hx     No Known Allergies  Current Outpatient Prescriptions on File Prior to Visit  Medication Sig Dispense Refill  . Calcium Carbonate-Vitamin D (CALTRATE 600+D) 600-400 MG-UNIT per tablet Take 2 tablets by mouth daily.     No current facility-administered medications on file prior to visit.    BP 80/62 mmHg  Pulse 62  Temp(Src) 98.1 F (36.7 C) (Oral)  Ht 5' 1.25" (1.556 m)  Wt 123 lb (55.792 kg)  BMI 23.04 kg/m2  SpO2 100%  LMP 06/26/2010       Objective:   Physical Exam  General Mental Status- Alert. General Appearance- Not in acute distress.   Skin General: Color- Normal Color. Moisture- Normal Moisture.  Neck Carotid Arteries- Normal color. Moisture- Normal Moisture. No carotid bruits. No JVD.  Chest and Lung Exam Auscultation: Breath Sounds:-Normal.  Cardiovascular Auscultation:Rythm- Regular. Murmurs & Other Heart Sounds:Auscultation of the heart reveals- No Murmurs.  Abdomen Inspection:-Inspeection Normal. Palpation/Percussion:Note:No mass. Palpation and Percussion of the abdomen reveal- Non Tender, Non Distended + BS,  no rebound or guarding.  Neurologic Cranial Nerve exam:- CN III-XII intact(No nystagmus), symmetric smile. Strength:- 5/5 equal and symmetric strength both upper and lower extremities.      Assessment & Plan:  ekg- does appear nsr. For your current faint atypical chest pressure sensation present since this am, I do want you evaluated in the ED. Repeat ekg and cardiac enzymes will likely be done. I have discussed this with ED physician downstairs and she knows you will come down.  Your recent dizziness and feeling as if you may pass out may also indicate doing some other stat labs such as cbc and cmp.  We can do other labs such as vitamin D level, fsh and other b12 later on follow up after ED evaluates you.  Follow up with [place] as determined by ED.  Pain less now after ekg but still faintly present.  Bleu Minerd, Percell Miller, PA-C

## 2016-04-22 NOTE — ED Provider Notes (Signed)
CSN: [id number]     Arrival date & time 04/22/16  1049 History   First MD Initiated Contact with Patient 04/22/16 1052     Chief Complaint  Patient presents with  . Chest Pain     (Consider location/radiation/quality/duration/timing/severity/associated sxs/prior Treatment) Patient is a 50 y.o. female presenting with chest pain.  Chest Pain Pain location:  L chest Pain quality: pressure   Pain quality comment:  Localized area left of sternal border Pain radiates to:  L shoulder Pain radiates to the back: no   Pain severity now: 8/10 at worst, currently no pain, only lightheadedness. Duration: episode today lasted 2 hr, has had episodes since Monday. Timing:  Intermittent Progression:  Waxing and waning Chronicity:  New Context: eating and at rest   Relieved by: praying, taking deep breaths, getting up. Associated symptoms: nausea, palpitations and shortness of breath   Associated symptoms: no abdominal pain, no back pain, no cough, no fever, no headache, no syncope and not vomiting  Diaphoresis: feels facial flushing.   Risk factors: no birth control, no coronary artery disease, no diabetes mellitus, no high cholesterol, no hypertension, no prior DVT/PE, no smoking and no surgery     Past Medical History  Diagnosis Date  . Anemia    Past Surgical History  Procedure Laterality Date  . Cesarean section      x 2   Family History  Problem Relation Age of Onset  . Hypertension Mother   . Hypertension Father   . Diabetes Father   . Hyperlipidemia Father   . Cancer Maternal Grandmother     breast?  . Heart disease Neg Hx   . Kidney disease Neg Hx    Social History  Substance Use Topics  . Smoking status: Never Smoker   . Smokeless tobacco: Never Used  . Alcohol Use: No   OB History    No data available     Review of Systems  Constitutional: Negative for fever. Diaphoresis: feels facial flushing.  HENT: Negative for sore throat.   Eyes: Negative for visual  disturbance.  Respiratory: Positive for shortness of breath. Negative for cough.   Cardiovascular: Positive for chest pain and palpitations. Negative for leg swelling and syncope.  Gastrointestinal: Positive for nausea. Negative for vomiting and abdominal pain.  Genitourinary: Negative for difficulty urinating.  Musculoskeletal: Negative for back pain and neck pain.  Skin: Negative for rash.  Neurological: Positive for light-headedness. Negative for syncope and headaches.  Psychiatric/Behavioral: The patient is nervous/anxious.       Allergies  Review of patient's allergies indicates no known allergies.  Home Medications   Prior to Admission medications   Medication Sig Start Date End Date Taking? Authorizing Provider  Calcium Carbonate-Vitamin D (CALTRATE 600+D) 600-400 MG-UNIT per tablet Take 2 tablets by mouth daily.    Historical Provider, MD  pantoprazole (PROTONIX) 20 MG tablet Take 2 tablets (40 mg total) by mouth daily. 04/22/16   Gareth Morgan, MD   BP 105/74 mmHg  Pulse 75  Resp 20  Wt 123 lb (55.792 kg)  SpO2 100%  LMP 06/26/2010 Physical Exam  Constitutional: She is oriented to person, place, and time. She appears well-developed and well-nourished. No distress.  HENT:  Head: Normocephalic and atraumatic.  Eyes: Conjunctivae and EOM are normal.  Neck: Normal range of motion.  Cardiovascular: Normal rate, regular rhythm, normal heart sounds and intact distal pulses.  Exam reveals no gallop and no friction rub.   No murmur heard. Pulmonary/Chest: Effort normal and  breath sounds normal. No respiratory distress. She has no wheezes. She has no rales.  Abdominal: Soft. She exhibits no distension. There is no tenderness. There is no guarding.  Musculoskeletal: She exhibits no edema or tenderness.  Neurological: She is alert and oriented to person, place, and time.  Skin: Skin is warm and dry. No rash noted. She is not diaphoretic. No erythema.  Nursing note and vitals  reviewed.   ED Course  Procedures (including critical care time) Labs Review Labs Reviewed  COMPREHENSIVE METABOLIC PANEL - Abnormal; Notable for the following:    Anion gap 3 (*)    All other components within normal limits  CBC WITH DIFFERENTIAL/PLATELET  BRAIN NATRIURETIC PEPTIDE  TROPONIN I  TROPONIN I  D-DIMER, QUANTITATIVE (NOT AT [facility])  TROPONIN I    Imaging Review Dg Chest 2 View  04/22/2016  CLINICAL DATA:  Shortness of breath. EXAM: CHEST  2 VIEW COMPARISON:  None. FINDINGS: The heart size and mediastinal contours are within normal limits. Both lungs are clear. No pneumothorax or pleural effusion is noted. The visualized skeletal structures are unremarkable. IMPRESSION: No active cardiopulmonary disease. Electronically Signed   By: Marijo Conception, M.D.   On: 04/22/2016 11:54   I have personally reviewed and evaluated these images and lab results as part of my medical decision-making.   EKG Interpretation   Date/Time:  Thursday April 22 2016 11:02:04 EDT Ventricular Rate:  63 PR Interval:  143 QRS Duration: 82 QT Interval:  386 QTC Calculation: 395 R Axis:   82 Text Interpretation:  Sinus rhythm Baseline wander in lead(s) II aVF No  previous ECGs available Confirmed by [facility] MD, Maikel Neisler ([id number]) on  04/22/2016 11:16:29 AM      MDM   Final diagnoses:  Chest pain, unspecified chest pain type   50 year old female with no significant medical history presents with concern of chest pain from PCP office. BP at PCP office was 80s/60s and was similar at last appointment, however BP in ED is 100s-120s and question accuracy of BP completed in clinic.  Differential diagnosis for chest pain includes pulmonary embolus, dissection, pneumothorax, pneumonia, ACS, myocarditis, pericarditis.  EKG was done and evaluate by me and showed no acute ST changes and no signs of pericarditis. Chest x-ray was done and evaluated by me and radiology and showed no sign of pneumonia or  pneumothorax. Patient is low risk [place] with negative ddimer and have low suspicion for PE.  Patient is low risk HEART score and had delta troponins which were both negative. Given this evaluation, history and physical have low suspicion for pulmonary embolus, pneumonia, ACS, myocarditis, pericarditis, dissection.   Possible etiologies of pain include GERD, hot flashes, anxiety, however recommend follow up with PCP within 1 week for further evaluation.  Gareth Morgan, MD 04/22/16 [phone]

## 2016-04-22 NOTE — Progress Notes (Signed)
Pre visit review using our clinic review tool, if applicable. No additional management support is needed unless otherwise documented below in the visit note. 

## 2016-04-22 NOTE — ED Notes (Signed)
Patient transported to X-ray via stretcher, sr x 2 up

## 2016-04-22 NOTE — ED Notes (Signed)
Upon arrival to room, pt placed on cont cardiac monitor, 12 lead ECG done, placed on cont POX and int NBP

## 2016-04-22 NOTE — Patient Instructions (Addendum)
For your current faint atypical chest pressure sensation present since this am, I do want you evaluated in the ED. Repeat ekg and cardiac enzymes will likely be done. I have discussed this with ED physician downstairs and she knows you will come down.  Your recent dizziness and feeling as if you may pass out may also indicate doing some other stat labs such as cbc and cmp.  We can do other labs such as vitamin D level, fsh and other b12 later on follow up after ED evaluates you.  Follow up with [place] as determined by ED.

## 2016-04-23 ENCOUNTER — Telehealth: Payer: Self-pay | Admitting: Family

## 2016-04-23 NOTE — Telephone Encounter (Signed)
Please arrange ED follow up with me in 1 week.

## 2016-04-26 NOTE — Telephone Encounter (Signed)
Scheduled for 05/03/16

## 2016-05-03 ENCOUNTER — Ambulatory Visit: Payer: BLUE CROSS/BLUE SHIELD | Admitting: Family

## 2016-05-03 ENCOUNTER — Telehealth: Payer: Self-pay | Admitting: Family

## 2016-05-03 NOTE — Telephone Encounter (Signed)
Noted, do not charge no show.

## 2016-05-03 NOTE — Telephone Encounter (Signed)
Pt lvm at 6:32 to cancel appt. Pt says that she will call back in at a later date to reschedule.

## 2018-06-09 ENCOUNTER — Ambulatory Visit (INDEPENDENT_AMBULATORY_CARE_PROVIDER_SITE_OTHER): Payer: BLUE CROSS/BLUE SHIELD | Admitting: Family

## 2018-06-09 ENCOUNTER — Encounter: Payer: Self-pay | Admitting: Family

## 2018-06-09 ENCOUNTER — Other Ambulatory Visit (HOSPITAL_COMMUNITY)
Admission: RE | Admit: 2018-06-09 | Discharge: 2018-06-09 | Disposition: A | Discharge status: Home / Self Care | Payer: BLUE CROSS/BLUE SHIELD | Source: Ambulatory Visit | Attending: Family | Admitting: Family

## 2018-06-09 VITALS — BP 110/62 | HR 70 | Temp 98.7°F | Ht 61.0 in | Wt 127.1 lb

## 2018-06-09 DIAGNOSIS — Z0000 Encounter for general adult medical examination without abnormal findings: Secondary | ICD-10-CM

## 2018-06-09 DIAGNOSIS — M8580 Other specified disorders of bone density and structure, unspecified site: Secondary | ICD-10-CM

## 2018-06-09 DIAGNOSIS — Z124 Encounter for screening for malignant neoplasm of cervix: Secondary | ICD-10-CM | POA: Diagnosis not present

## 2018-06-09 DIAGNOSIS — N889 Noninflammatory disorder of cervix uteri, unspecified: Secondary | ICD-10-CM

## 2018-06-09 MED ORDER — MECLIZINE HCL 25 MG PO TABS: 25.0000 mg | ORAL_TABLET | Freq: Three times a day (TID) | ORAL | 0 refills | Status: DC | PRN

## 2018-06-09 NOTE — Patient Instructions (Addendum)
Please schedule a routine dental exam and eye exam.  Schedule mammogram and bone density on the first floor. Complete lab work prior to leaving. Continue healthy diet and regular exercise. You may use meclizine as needed for vertigo- let me know if symptoms worsen or fail to improve.

## 2018-06-09 NOTE — Progress Notes (Signed)
Subjective:    Patient ID: Diana Norris, female    DOB: 07-Nov-1966, 52 y.o.   MRN: [id number]  HPI  Patient is a 52 year old female who presents today for complete physical.  Patient presents today for complete physical.  Immunizations: tetanus 2012 Diet: reports that her diet is generally healthy Exercise:  Reports that she walks, biking at the [facility] Colonoscopy: due Dexa: due Pap Smear:  02/26/14 Mammogram: 03/12/14 Dental: due Vision: due Wt Readings from Last 3 Encounters:  06/09/18 127 lb 2 oz (57.7 kg)  04/22/16 123 lb (55.8 kg)  04/22/16 123 lb (55.8 kg)   Dizziness- note intermittent dizziness  Review of Systems  Constitutional: Negative for unexpected weight change.  HENT: Negative for hearing loss and rhinorrhea.   Eyes:       Using reading glasses  Respiratory: Negative for cough.   Cardiovascular: Negative for leg swelling.  Gastrointestinal: Negative for blood in stool, constipation and diarrhea.  Genitourinary: Negative for dysuria, frequency and hematuria.  Musculoskeletal: Negative for arthralgias and myalgias.  Skin: Negative for rash.  Neurological: Negative for headaches.  Hematological: Negative for adenopathy.  Psychiatric/Behavioral:       Denies depression/anxiety   Past Medical History:  Diagnosis Date  . Anemia      Social History   Socioeconomic History  . Marital status: Married    Spouse name: Not on file  . Number of children: Not on file  . Years of education: Not on file  . Highest education level: Not on file  Occupational History  . Not on file  Social Needs  . Financial resource strain: Not on file  . Food insecurity:    Worry: Not on file    Inability: Not on file  . Transportation needs:    Medical: Not on file    Non-medical: Not on file  Tobacco Use  . Smoking status: Never Smoker  . Smokeless tobacco: Never Used  Substance and Sexual Activity  . Alcohol use: No  . Drug use: Not on file  . Sexual activity: Not  on file  Lifestyle  . Physical activity:    Days per week: Not on file    Minutes per session: Not on file  . Stress: Not on file  Relationships  . Social connections:    Talks on phone: Not on file    Gets together: Not on file    Attends religious service: Not on file    Active member of club or organization: Not on file    Attends meetings of clubs or organizations: Not on file    Relationship status: Not on file  . Intimate partner violence:    Fear of current or ex partner: Not on file    Emotionally abused: Not on file    Physically abused: Not on file    Forced sexual activity: Not on file  Other Topics Concern  . Not on file  Social History Narrative   Married   Has bachelor degree, works in Press photographer.     3 children 63 son, 77 and 19 daughters   Enjoys dancing, spending time with kids    Past Surgical History:  Procedure Laterality Date  . CESAREAN SECTION     x 2    Family History  Problem Relation Age of Onset  . Hypertension Mother   . Hypertension Father   . Diabetes Father   . Hyperlipidemia Father   . Cancer Maternal Grandmother  breast?  . Heart disease Neg Hx   . Kidney disease Neg Hx     No Known Allergies  Current Outpatient Medications on File Prior to Visit  Medication Sig Dispense Refill  . Calcium Carbonate-Vitamin D (CALTRATE 600+D) 600-400 MG-UNIT per tablet Take 2 tablets by mouth daily.     No current facility-administered medications on file prior to visit.     BP 110/62 (BP Location: Left Arm, Patient Position: Sitting, Cuff Size: Normal)   Pulse 70   Temp 98.7 F (37.1 C) (Oral)   Ht 5\' 1"  (1.549 m)   Wt 127 lb 2 oz (57.7 kg)   LMP 06/26/2010   SpO2 94%   BMI 24.02 kg/m       Objective:   Physical Exam   Physical Exam  Constitutional: She is oriented to person, place, and time. She appears well-developed and well-nourished. No distress.  HENT:  Head: Normocephalic and atraumatic.  Right Ear: Tympanic  membrane and ear canal normal.  Left Ear: Tympanic membrane and ear canal normal.  Mouth/Throat: Oropharynx is clear and moist.  Eyes: Pupils are equal, round, and reactive to light. No scleral icterus.  Neck: Normal range of motion. No thyromegaly present.  Cardiovascular: Normal rate and regular rhythm.   No murmur heard. Pulmonary/Chest: Effort normal and breath sounds normal. No respiratory distress. He has no wheezes. She has no rales. She exhibits no tenderness.  Abdominal: Soft. Bowel sounds are normal. She exhibits no distension and no mass. There is no tenderness. There is no rebound and no guarding.  Musculoskeletal: She exhibits no edema.  Lymphadenopathy:    She has no cervical adenopathy.  Neurological: She is alert and oriented to person, place, and time. She has normal patellar reflexes. She exhibits normal muscle tone. Coordination normal.  Skin: Skin is warm and dry.  Psychiatric: She has a normal mood and affect. Her behavior is normal. Judgment and thought content normal.  Breasts: Examined lying Right: Without masses, retractions, discharge or axillary adenopathy.  Left: Without masses, retractions, discharge or axillary adenopathy.  Inguinal/mons: Normal without inguinal adenopathy  External genitalia: Normal  BUS/Urethra/Skene's glands: Normal  Bladder: Normal  Vagina: Normal  Cervix: small lesion at 2 oclock (? Nabothian cyst versus other) Uterus: normal in size, shape and contour. Midline and mobile  Adnexa/parametria:  Rt: Without masses or tenderness.  Lt: Without masses or tenderness.  Anus and perineum: Normal            Assessment & Plan:   Preventative Care- discussed healthy diet and exercise. Pap performed today. Obtain routine lab work. Refer for mammo, colo, dexa. EKG tracing is personally reviewed.  EKG notes NSR.  No acute changes.   Cervical Lesion-   ?nabothian cyst versus other. Will refer to GYN for further evaluation.  Vertigo-  vertigo was worsened by suddenly laying flat on exam table. Reports + hx of vertigo. Will give rx for meclizine. Consider referral for vestibular rehab if symptoms worsen or fail to improve.  Assessment & Plan:

## 2018-06-10 LAB — URINALYSIS, ROUTINE W REFLEX MICROSCOPIC
BACTERIA UA: NONE SEEN /HPF
Bilirubin Urine: NEGATIVE
Glucose, UA: NEGATIVE
Hgb urine dipstick: NEGATIVE
Hyaline Cast: NONE SEEN /LPF
Ketones, ur: NEGATIVE
Nitrite: NEGATIVE
Protein, ur: NEGATIVE
RBC / HPF: NONE SEEN /HPF (ref 0–2)
SPECIFIC GRAVITY, URINE: 1.014 (ref 1.001–1.03)
Squamous Epithelial / LPF: NONE SEEN /HPF (ref <=5)
pH: 6.5 (ref 5.0–8.0)

## 2018-06-10 LAB — CBC WITH DIFFERENTIAL/PLATELET
BASOS PCT: 0.5 %
Basophils Absolute: 37 cells/uL (ref 0–200)
Eosinophils Absolute: 52 cells/uL (ref 15–500)
Eosinophils Relative: 0.7 %
HCT: 41.2 % (ref 35.0–45.0)
HEMOGLOBIN: 13.8 g/dL (ref 11.7–15.5)
Lymphs Abs: 1680 cells/uL (ref 850–3900)
MCH: 29 pg (ref 27.0–33.0)
MCHC: 33.5 g/dL (ref 32.0–36.0)
MCV: 86.6 fL (ref 80.0–100.0)
MONOS PCT: 4.8 %
MPV: 12.4 fL (ref 7.5–12.5)
NEUTROS ABS: 5276 {cells}/uL (ref 1500–7800)
Neutrophils Relative %: 71.3 %
PLATELETS: 221 10*3/uL (ref 140–400)
RBC: 4.76 10*6/uL (ref 3.80–5.10)
RDW: 12.2 % (ref 11.0–15.0)
TOTAL LYMPHOCYTE: 22.7 %
WBC mixed population: 355 cells/uL (ref 200–950)
WBC: 7.4 10*3/uL (ref 3.8–10.8)

## 2018-06-10 LAB — BASIC METABOLIC PANEL
BUN: 12 mg/dL (ref 7–25)
CO2: 29 mmol/L (ref 20–32)
Calcium: 9.3 mg/dL (ref 8.6–10.4)
Chloride: 102 mmol/L (ref 98–110)
Creat: 0.76 mg/dL (ref 0.50–1.05)
GLUCOSE: 85 mg/dL (ref 65–99)
Potassium: 4.1 mmol/L (ref 3.5–5.3)
Sodium: 139 mmol/L (ref 135–146)

## 2018-06-10 LAB — LIPID PANEL
CHOL/HDL RATIO: 2.8 (calc) (ref <5.0)
Cholesterol: 149 mg/dL (ref <200)
HDL: 54 mg/dL (ref >50)
LDL CHOLESTEROL (CALC): 77 mg/dL
Non-HDL Cholesterol (Calc): 95 mg/dL (calc) (ref <130)
Triglycerides: 98 mg/dL (ref <150)

## 2018-06-10 LAB — HEPATIC FUNCTION PANEL
AG Ratio: 1.7 (calc) (ref 1.0–2.5)
ALBUMIN MSPROF: 4.5 g/dL (ref 3.6–5.1)
ALT: 21 U/L (ref 6–29)
AST: 24 U/L (ref 10–35)
Alkaline phosphatase (APISO): 101 U/L (ref 33–130)
Bilirubin, Direct: 0.1 mg/dL (ref 0.0–0.2)
Globulin: 2.6 g/dL (calc) (ref 1.9–3.7)
Indirect Bilirubin: 0.4 mg/dL (calc) (ref 0.2–1.2)
TOTAL PROTEIN: 7.1 g/dL (ref 6.1–8.1)
Total Bilirubin: 0.5 mg/dL (ref 0.2–1.2)

## 2018-06-10 LAB — TSH: TSH: 1.11 mIU/L

## 2018-06-14 LAB — CYTOLOGY - PAP
Diagnosis: NEGATIVE
HPV (WINDOPATH): NOT DETECTED

## 2018-06-23 ENCOUNTER — Other Ambulatory Visit (HOSPITAL_BASED_OUTPATIENT_CLINIC_OR_DEPARTMENT_OTHER): Payer: BLUE CROSS/BLUE SHIELD

## 2018-06-23 ENCOUNTER — Ambulatory Visit (HOSPITAL_BASED_OUTPATIENT_CLINIC_OR_DEPARTMENT_OTHER): Payer: BLUE CROSS/BLUE SHIELD

## 2018-06-26 ENCOUNTER — Ambulatory Visit (HOSPITAL_BASED_OUTPATIENT_CLINIC_OR_DEPARTMENT_OTHER)
Admission: RE | Admit: 2018-06-26 | Discharge: 2018-06-26 | Disposition: A | Discharge status: Home / Self Care | Payer: BLUE CROSS/BLUE SHIELD | Source: Ambulatory Visit | Attending: Family | Admitting: Family

## 2018-06-26 ENCOUNTER — Encounter: Payer: Self-pay | Admitting: Family

## 2018-06-26 DIAGNOSIS — Z780 Asymptomatic menopausal state: Secondary | ICD-10-CM | POA: Diagnosis not present

## 2018-06-26 DIAGNOSIS — Z0000 Encounter for general adult medical examination without abnormal findings: Secondary | ICD-10-CM | POA: Diagnosis not present

## 2018-06-26 DIAGNOSIS — M8589 Other specified disorders of bone density and structure, multiple sites: Secondary | ICD-10-CM | POA: Diagnosis not present

## 2018-06-26 DIAGNOSIS — M8580 Other specified disorders of bone density and structure, unspecified site: Secondary | ICD-10-CM | POA: Insufficient documentation

## 2018-06-26 DIAGNOSIS — Z1231 Encounter for screening mammogram for malignant neoplasm of breast: Secondary | ICD-10-CM | POA: Diagnosis not present

## 2018-07-10 NOTE — Telephone Encounter (Signed)
Please contact pt re: unread mychart message. 

## 2018-07-12 NOTE — Telephone Encounter (Signed)
Notified pt and she states she has seen the result and will try to remember to take the caltrate D consistently.

## 2018-07-24 ENCOUNTER — Ambulatory Visit (INDEPENDENT_AMBULATORY_CARE_PROVIDER_SITE_OTHER): Payer: BLUE CROSS/BLUE SHIELD | Admitting: Obstetrics & Gynecology

## 2018-07-24 ENCOUNTER — Encounter: Payer: Self-pay | Admitting: Obstetrics & Gynecology

## 2018-07-24 VITALS — BP 99/65 | HR 70 | Ht 61.0 in | Wt 127.0 lb

## 2018-07-24 DIAGNOSIS — N888 Other specified noninflammatory disorders of cervix uteri: Secondary | ICD-10-CM

## 2018-07-24 DIAGNOSIS — F5231 Female orgasmic disorder: Secondary | ICD-10-CM | POA: Diagnosis not present

## 2018-07-24 NOTE — Patient Instructions (Signed)
Atrophic Vaginitis Atrophic vaginitis is a condition in which the tissues that line the vagina become dry and thin. This condition is most common in women who have stopped having regular menstrual periods (menopause). This usually starts when a woman is 45-51 years old. Estrogen helps to keep the vagina moist. It stimulates the vagina to produce a clear fluid that lubricates the vagina for sexual intercourse. This fluid also protects the vagina from infection. Lack of estrogen can cause the lining of the vagina to get thinner and dryer. The vagina may also shrink in size. It may become less elastic. Atrophic vaginitis tends to get worse over time as a woman's estrogen level drops. What are the causes? This condition is caused by the normal drop in estrogen that happens around the time of menopause. What increases the risk? Certain conditions or situations may lower a woman's estrogen level, which increases her risk of atrophic vaginitis. These include:  Taking medicine that blocks estrogen.  Having ovaries removed surgically.  Being treated for cancer with X-ray treatment (radiation) or medicines (chemotherapy).  Exercising very hard and often.  Having an eating disorder (anorexia).  Giving birth or breastfeeding.  Being over the age of 50.  Smoking.  What are the signs or symptoms? Symptoms of this condition include:  Pain, soreness, or bleeding during sexual intercourse (dyspareunia).  Vaginal burning, irritation, or itching.  Pain or bleeding during a vaginal examination using a speculum (pelvic exam).  Loss of interest in sexual activity.  Having burning pain when passing urine.  Vaginal discharge that is brown or yellow.  In some cases, there are no symptoms. How is this diagnosed? This condition is diagnosed with a medical history and physical exam. This will include a pelvic exam that checks whether the inside of your vagina appears pale, thin, or dry. Rarely, you may  also have other tests, including:  A urine test.  A test that checks the acid balance in your vaginal fluid (acid balance test).  How is this treated? Treatment for this condition may depend on the severity of your symptoms. Treatment may include:  Using an over-the-counter vaginal lubricant before you have sexual intercourse.  Using a long-acting vaginal moisturizer.  Using low-dose vaginal estrogen for moderate to severe symptoms that do not respond to other treatments. Options include creams, tablets, and inserts (vaginal rings). Before using vaginal estrogen, tell your health care provider if you have a history of: ? Breast cancer. ? Endometrial cancer. ? Blood clots.  Taking medicines. You may be able to take a daily pill for dyspareunia. Discuss all of the risks of this medicine with your health care provider. It is usually not recommended for women who have a family history or personal history of breast cancer.  If your symptoms are very mild and you are not sexually active, you may not need treatment. Follow these instructions at home:  Take medicines only as directed by your health care provider. Do not use herbal or alternative medicines unless your health care provider says that you can.  Use over-the-counter creams, lubricants, or moisturizers for dryness only as directed by your health care provider.  If your atrophic vaginitis is caused by menopause, discuss all of your menopausal symptoms and treatment options with your health care provider.  Do not douche.  Do not use products that can make your vagina dry. These include: ? Scented feminine sprays. ? Scented tampons. ? Scented soaps.  If it hurts to have sex, talk with your sexual   partner. Contact a health care provider if:  Your discharge looks different than normal.  Your vagina has an unusual smell.  You have new symptoms.  Your symptoms do not improve with treatment.  Your symptoms get worse. This  information is not intended to replace advice given to you by your health care provider. Make sure you discuss any questions you have with your health care provider. Document Released: 04/29/2015 Document Revised: 05/20/2016 Document Reviewed: 12/04/2014 Elsevier Interactive Patient Education  2018 [employer]vier Inc[employer]  

## 2018-07-24 NOTE — Progress Notes (Signed)
Subjective:     Diana Norris is a 52 y.o. female here for a routine exam. [id number] LMP 46 years. Current complaints: pt reports "that 'orgasm' doesn't happen easily". Pt doesn't to feel that its related to dryness. She denies pain with intercourse. 2 kids at home 60 year and 18 years. Pt works full time. Pt feel that this is a change from prev but, not 'significantly.'  She does feel like she's exhausted or she feels that she doesn't feel that its the same if they are alone.      Gynecologic History Patient's last menstrual period was 06/26/2010. Contraception: post menopausal status Last Pap: 06/14/2018. Results were: normal Last mammogram: 06/26/2018. Results were: normal  Obstetric History OB History  Gravida Para Term Preterm AB Living  5 4 3 1 1 3   SAB TAB Ectopic Multiple Live Births    1     3    # Outcome Date GA Lbr Len/2nd Weight Sex Delivery Anes PTL Lv  5 Term 2003 [redacted]w[redacted]d   F CS-LTranv Spinal  LIV  4 Preterm 2001 [redacted]w[redacted]d   F CS-LTranv Spinal  LIV  3 Term 2000 [redacted]w[redacted]d       FD  2 Term 1997 [redacted]w[redacted]d   M Vag-Spont Local  LIV  1 TAB 1993             The following portions of the patient's history were reviewed and updated as appropriate: allergies, current medications, past family history, past medical history, past social history, past surgical history and problem list.  Review of Systems Pertinent items are noted in HPI.    Objective:  BP 99/65   Pulse 70   Ht 5\' 1"  (1.549 m)   Wt 127 lb 0.6 oz (57.6 kg)   LMP 06/26/2010   BMI 24.00 kg/m   CONSTITUTIONAL: Well-developed, well-nourished female in no acute distress.  HENT:  Normocephalic, atraumatic EYES: Conjunctivae and EOM are normal. No scleral icterus.  NECK: Normal range of motion SKIN: Skin is warm and dry. No rash noted. Not diaphoretic.No pallor. [place]: Alert and oriented to person, place, and time. Normal coordination.  GU: EGBUS: no lesions; atrophic Vagina: no blood in vault; adequate lubrication in  inside of vagina. Introitus atrophic Cervix: very small Nabothian cyst noted o/w no lesion; no mucopurulent d/c Uterus: small, mobile  Adnexa: no masses; non tender    06/14/2018 Component 31mo ago  Adequacy Satisfactory for evaluation endocervical/transformation zone component PRESENT.   Diagnosis NEGATIVE FOR INTRAEPITHELIAL LESIONS OR MALIGNANCY.   HPV NOT DETECTED   Comment: Normal Reference Range - NOT Detected  Material Submitted CervicoVaginal Pap [ThinPrep Imaged]     Assessment:  Small Nabothian cyst- not pathologic. Inhibited libido- after discussion may not be related to organic cause but, may also be exacerbated by GSM as some atrophy was noted.  Recommended date nights with husband without her children .   Pt offered topical EES. She would like to try nonhomonal options first.    Plan:    Follow up in: 6 weeks.    Date nights with spouse Coconut oil to external genitalia bid  Total face-to-face time with patient was 30 min.  Greater than 50% was spent in counseling and coordination of care with the patient.   Camyra Vaeth L. Harraway-Smith, M.D., Cherlynn June

## 2018-09-11 ENCOUNTER — Ambulatory Visit: Payer: BLUE CROSS/BLUE SHIELD | Admitting: Obstetrics & Gynecology

## 2020-02-01 DIAGNOSIS — J069 Acute upper respiratory infection, unspecified: Secondary | ICD-10-CM | POA: Diagnosis not present

## 2020-02-01 DIAGNOSIS — Z20828 Contact with and (suspected) exposure to other viral communicable diseases: Secondary | ICD-10-CM | POA: Diagnosis not present

## 2020-08-15 DIAGNOSIS — Z1152 Encounter for screening for COVID-19: Secondary | ICD-10-CM | POA: Diagnosis not present

## 2021-09-02 ENCOUNTER — Encounter: Payer: Self-pay | Admitting: Family

## 2021-09-02 ENCOUNTER — Other Ambulatory Visit (HOSPITAL_COMMUNITY)
Admission: RE | Admit: 2021-09-02 | Discharge: 2021-09-02 | Disposition: A | Discharge status: Home / Self Care | Payer: No Typology Code available for payment source | Source: Ambulatory Visit | Attending: Family | Admitting: Family

## 2021-09-02 ENCOUNTER — Ambulatory Visit (INDEPENDENT_AMBULATORY_CARE_PROVIDER_SITE_OTHER): Payer: No Typology Code available for payment source | Admitting: Family

## 2021-09-02 ENCOUNTER — Other Ambulatory Visit: Payer: Self-pay

## 2021-09-02 VITALS — BP 107/71 | HR 78 | Temp 98.6°F | Resp 16 | Ht 61.0 in | Wt 124.0 lb

## 2021-09-02 DIAGNOSIS — Z01419 Encounter for gynecological examination (general) (routine) without abnormal findings: Secondary | ICD-10-CM | POA: Diagnosis present

## 2021-09-02 DIAGNOSIS — Z23 Encounter for immunization: Secondary | ICD-10-CM

## 2021-09-02 DIAGNOSIS — Z0000 Encounter for general adult medical examination without abnormal findings: Secondary | ICD-10-CM

## 2021-09-02 DIAGNOSIS — Z0001 Encounter for general adult medical examination with abnormal findings: Secondary | ICD-10-CM | POA: Diagnosis not present

## 2021-09-02 DIAGNOSIS — L989 Disorder of the skin and subcutaneous tissue, unspecified: Secondary | ICD-10-CM

## 2021-09-02 DIAGNOSIS — R232 Flushing: Secondary | ICD-10-CM | POA: Diagnosis not present

## 2021-09-02 DIAGNOSIS — R3915 Urgency of urination: Secondary | ICD-10-CM

## 2021-09-02 LAB — URINALYSIS, ROUTINE W REFLEX MICROSCOPIC
Bilirubin Urine: NEGATIVE
Hgb urine dipstick: NEGATIVE
Ketones, ur: NEGATIVE
Leukocytes,Ua: NEGATIVE
Nitrite: NEGATIVE
Specific Gravity, Urine: 1.025 (ref 1.000–1.030)
Total Protein, Urine: NEGATIVE
Urine Glucose: NEGATIVE
Urobilinogen, UA: 0.2 (ref 0.0–1.0)
pH: 6 (ref 5.0–8.0)

## 2021-09-02 LAB — LIPID PANEL
Cholesterol: 157 mg/dL (ref 0–200)
HDL: 57.2 mg/dL (ref >39.00)
LDL Cholesterol: 84 mg/dL (ref 0–99)
NonHDL: 100.28
Total CHOL/HDL Ratio: 3
Triglycerides: 82 mg/dL (ref 0.0–149.0)
VLDL: 16.4 mg/dL (ref 0.0–40.0)

## 2021-09-02 LAB — COMPREHENSIVE METABOLIC PANEL
ALT: 62 U/L — ABNORMAL HIGH (ref 0–35)
AST: 48 U/L — ABNORMAL HIGH (ref 0–37)
Albumin: 4.2 g/dL (ref 3.5–5.2)
Alkaline Phosphatase: 122 U/L — ABNORMAL HIGH (ref 39–117)
BUN: 16 mg/dL (ref 6–23)
CO2: 30 mEq/L (ref 19–32)
Calcium: 9.5 mg/dL (ref 8.4–10.5)
Chloride: 102 mEq/L (ref 96–112)
Creatinine, Ser: 0.71 mg/dL (ref 0.40–1.20)
GFR: 96.51 mL/min (ref >60.00)
Glucose, Bld: 86 mg/dL (ref 70–99)
Potassium: 4.6 mEq/L (ref 3.5–5.1)
Sodium: 139 mEq/L (ref 135–145)
Total Bilirubin: 0.4 mg/dL (ref 0.2–1.2)
Total Protein: 7 g/dL (ref 6.0–8.3)

## 2021-09-02 LAB — TSH: TSH: 1.58 u[IU]/mL (ref 0.35–5.50)

## 2021-09-02 NOTE — Assessment & Plan Note (Addendum)
Wt Readings from Last 3 Encounters:  09/02/21 124 lb (56.2 kg)  07/24/18 127 lb 0.6 oz (57.6 kg)  06/09/18 127 lb 2 oz (57.7 kg)   Continue healthy diet, exercise.  Pt is requesting labs as ordered. Flu shot and Tdap today. She wishes to hold off of on Shingrix.  Pap performed today.  Refer for mammo and colo.

## 2021-09-02 NOTE — Progress Notes (Signed)
Subjective:   By signing my name below, I, Shehryar Baig, attest that this documentation has been prepared under the direction and in the presence of Debbrah Alar NP. 09/02/2021     Patient ID: Diana Norris, female    DOB: [birth year], 55 y.o.   MRN: [id number]  Chief Complaint  Patient presents with   Annual Exam    HPI Patient is in today for a comprehensive physical exam.   Urine- She reports having urgency when holding in her urine. She also notes drinking more water in her diet recently and thinks it may be due to that.  Hot flashes- She complains of hot flashes returning since last week. She finds random times through out the day she feels hot and breaks out in sweat. Her last menstrual cycle was 9 years ago when she was 55 years old. She has already passed pre-menopause stage. She is requesting to check during her next lab work.   Skin tags- She complains of skin tags on both of her arms, legs, and neck. She notes that some of them has increased in size since she started observing them. She is interested in seeing a dermatologist to manage her symptoms.  Cholesterol- She is willing to get her cholesterol levels checked during her next lab work.   Lab Results  Component Value Date   CHOL 149 06/09/2018   HDL 54 06/09/2018   LDLCALC 77 06/09/2018   TRIG 98 06/09/2018   CHOLHDL 2.8 06/09/2018   She denies having any unexpected weight change, ear pain, hearing loss and rhinorrhea, visual disturbance, cough, chest pain and leg swelling, nausea, vomiting, diarrhea and blood in stool, or dysuria and frequency, for myalgias and arthralgias, rash, headaches, adenopathy, depression or anxiety at this time. 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 Her father has recently developed kidney issues recently, her mother developed vertigo, otherwise there is no recent changes to her family medical history.  She does  not drink alcohol. She does not use drugs. She does not use tobacco products and vaping products.   Immunizations: She is UTD on tetanus vaccines. She was given a flu vaccine during this visit. She is eligible for the shingles vaccine and not interested in getting it during this visit. She has 2 Covid-19 vaccines at this time and is interested in getting the new booster vaccine after it releases in the fall season.  She is not interested in HIV or hepatitis C screening at this time.  Diet: She stopped eating processes foods and refined sugar in June, 2022 and reports losing 17 lb's and feeling less fatigued.  Exercise: She does not participate in regular exercise. She sits on her desk 8-10 hours daily due to work. She is planning to starting exercise.  Colonoscopy: Not yet completed. She is interested in setting up an appointment to get it completed.  Dexa: Last completed 06/26/2018. Results showed she is osteopenic. Repeat in 2 years.  Pap Smear: Last completed 06/09/2018. Results are normal. Repeat in 3 years.  Mammogram: Last completed 06/26/2018. Results are normal. Repeat in 1 year. Due. She is interested in setting up an appointment.  Dental:  Vision: She reports having a pterygium growing on her right eye but it does not bother her at this time. She is UTD on vision care at this time.   Health Maintenance Due  Topic Date Due   COLONOSCOPY (Pts 45-56yr Insurance coverage  will need to be confirmed)  Never done   Zoster Vaccines- Shingrix (1 of 2) Never done   MAMMOGRAM  06/26/2020   COVID-19 Vaccine (3 - Booster for Pfizer series) 12/14/2020   PAP SMEAR-Modifier  06/09/2021    Past Medical History:  Diagnosis Date   Anemia     Past Surgical History:  Procedure Laterality Date   CESAREAN SECTION     x 2   EYE SURGERY Left    L Ptergium removal    Family History  Problem Relation Age of Onset   Hypertension Mother        arthritis, vertigo   Hypertension Father    Diabetes  Father        renal insufficiency   Hyperlipidemia Father    Hypertension Sister    Cancer Maternal Grandmother        breast?   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     Calcium Carbonate-Vitamin D (CALTRATE 600+D) 600-400 MG-UNIT per tablet Take 2 tablets by mouth daily.     meclizine (ANTIVERT) 25 MG tablet Take 1 tablet (25 mg total) by mouth 3 (three) times daily as needed for dizziness. (Patient not taking: Reported on 07/24/2018) 30 tablet 0   No facility-administered medications prior to visit.    No Known Allergies  Review of Systems  Constitutional:        (-)unexpected weight change (-)Adenopathy (+)Hot flashes  HENT:  Negative for hearing loss.        (-)Rhinorrhea   Eyes:        (-)Visual disturbance  Respiratory:  Negative for cough.    Cardiovascular:  Negative for chest pain and leg swelling.  Gastrointestinal:  Negative for blood in stool, constipation, diarrhea, nausea and vomiting.  Genitourinary:  Positive for urgency. Negative for dysuria and frequency.  Musculoskeletal:  Negative for joint pain and myalgias.  Skin:  Negative for rash.       (+)Skin tags on bilateral arms, bilateral legs, and on her neck  Neurological:  Negative for headaches.  Psychiatric/Behavioral:  Negative for depression. The patient is not nervous/anxious.       Objective:    Physical  Exam Exam conducted with a chaperone present.  Constitutional:      General: She is not in acute distress.    Appearance: Normal appearance. She is not ill-appearing.  HENT:     Head: Normocephalic and atraumatic.     Right Ear: Tympanic membrane, ear canal and external ear normal.     Left Ear: Tympanic membrane, ear canal and external ear normal.  Eyes:     Extraocular Movements: Extraocular movements intact.     Pupils: Pupils are equal, round, and reactive to light.     Comments: No nystagmus  Cardiovascular:     Rate and Rhythm: Normal rate and regular rhythm.     Heart sounds: Normal heart sounds. No murmur heard.   No gallop.  Pulmonary:     Effort: Pulmonary effort is normal. No respiratory distress.     Breath sounds: Normal breath sounds. No wheezing or rales.  Chest:  Breasts:    Breasts are symmetrical.     Comments: Indentation of breast tissue at 11 oclock. No thickening or masses noted.   Abdominal:     General: There is no distension.     Palpations: Abdomen is soft.     Tenderness: There is no abdominal tenderness. There is no guarding.  Genitourinary:    General: Normal vulva.     Exam position: Lithotomy position.     Pubic Area: No rash.      Labia:        Right: No rash or lesion.        Left: No rash or lesion.      Urethra: No prolapse or urethral swelling.     Comments: Atrophic vaginal tissues Musculoskeletal:      Comments: 5/5 strength in both upper and lower extremities  Lymphadenopathy:     Cervical: No cervical adenopathy.  Skin:    General: Skin is warm and dry.     Comments: Large tag like nevus anterior to right ear  Hypertrophic lesion left forearm  Neurological:     Mental Status: She is alert and oriented to person, place, and time.     Deep Tendon Reflexes:     Reflex Scores:      Patellar reflexes are 2+ on the right side and 2+ on the left side. Psychiatric:        Behavior: Behavior normal.    BP 107/71 (BP Location: Right Arm, Patient Position: Sitting, Cuff Size: Small)   Pulse 78   Temp 98.6 F (37 C) (Oral)   Resp 16   Ht _0  (1.549 m)   Wt 124 lb (56.2 kg)   LMP 06/26/2010   SpO2 100%   BMI 23.43 kg/m  Wt Readings from Last 3 Encounters:  09/02/21 124 lb (56.2 kg)  07/24/18 127 lb 0.6 oz (57.6 kg)  06/09/18 127 lb 2 oz (57.7 kg)       Assessment & Plan:   Problem List Items Addressed This Visit       Unprioritized   Skin lesions   Relevant Orders   Ambulatory referral to Dermatology   Routine general medical examination at a health care facility    Wt Readings from Last 3 Encounters:  09/02/21 124 lb (56.2 kg)  07/24/18 127 lb 0.6 oz (57.6 kg)  06/09/18 127 lb 2 oz (57.7 kg)  Continue healthy diet, exercise.  Pt is requesting labs as ordered. Flu shot and Tdap today. She wishes to hold off of on Shingrix.  Pap  performed today.  Refer for mammo and colo.        Other Visit Diagnoses     Needs flu shot    -  Primary   Relevant Orders   Flu Vaccine QUAD 6+ mos PF IM (Fluarix Quad PF) (Completed)   Preventative health care       Relevant Orders   Ambulatory referral to Gastroenterology   MM 3D SCREEN BREAST BILATERAL   Lipid panel   Comp Met (CMET)   Hot flashes       Relevant Orders   TSH   Urinary urgency       Relevant Orders   Urinalysis, Routine w reflex microscopic   Need for diphtheria-tetanus-pertussis (Tdap) vaccine       Relevant  Orders   Tdap vaccine greater than or equal to 7yo IM (Completed)   Encounter for routine gynecological examination with Papanicolaou smear of cervix       Relevant Orders   Cytology - PAP( [facility])        No orders of the defined types were placed in this encounter.   I, Debbrah Alar NP, personally preformed the services described in this documentation.  All medical record entries made by the scribe were at my direction and in my presence.  I have reviewed the chart and discharge instructions (if applicable) and agree that the record reflects my personal performance and is accurate and complete. 09/02/2021   I,Shehryar Baig,acting as a scribe for Nance Pear, NP.,have documented all relevant documentation on the behalf of Nance Pear, NP,as directed by  Nance Pear, NP while in the presence of Nance Pear, NP.   Nance Pear, NP

## 2021-09-05 ENCOUNTER — Telehealth: Payer: Self-pay | Admitting: Family

## 2021-09-05 DIAGNOSIS — R945 Abnormal results of liver function studies: Secondary | ICD-10-CM

## 2021-09-05 DIAGNOSIS — R7989 Other specified abnormal findings of blood chemistry: Secondary | ICD-10-CM

## 2021-09-05 NOTE — Telephone Encounter (Signed)
Liver testing is mildly abnormal. I would recommend an ultrasound to check her liver/gallbladder.

## 2021-09-07 LAB — CYTOLOGY - PAP
Comment: NEGATIVE
Diagnosis: NEGATIVE
High risk HPV: NEGATIVE

## 2021-09-07 NOTE — Telephone Encounter (Signed)
Patient advised of results and provider's advise. She is ok with getting [place], order was signed.

## 2021-09-15 ENCOUNTER — Ambulatory Visit (HOSPITAL_BASED_OUTPATIENT_CLINIC_OR_DEPARTMENT_OTHER)
Admission: RE | Admit: 2021-09-15 | Discharge: 2021-09-15 | Disposition: A | Discharge status: Home / Self Care | Payer: No Typology Code available for payment source | Source: Ambulatory Visit | Attending: Family | Admitting: Family

## 2021-09-15 ENCOUNTER — Other Ambulatory Visit: Payer: Self-pay

## 2021-09-15 DIAGNOSIS — R945 Abnormal results of liver function studies: Secondary | ICD-10-CM | POA: Insufficient documentation

## 2021-09-16 ENCOUNTER — Telehealth: Payer: Self-pay | Admitting: Family

## 2021-09-16 DIAGNOSIS — R7989 Other specified abnormal findings of blood chemistry: Secondary | ICD-10-CM

## 2021-09-16 DIAGNOSIS — R945 Abnormal results of liver function studies: Secondary | ICD-10-CM

## 2021-09-16 NOTE — Telephone Encounter (Signed)
Abdominal US is normal. I would like for her to complete some additional blood work to evaluate her elevated liver function tests. Orders have been placed.

## 2021-09-16 NOTE — Telephone Encounter (Signed)
Patient advised of results and scheduled to come in for labs on 10-10 after her mammogram

## 2021-10-05 ENCOUNTER — Ambulatory Visit (HOSPITAL_BASED_OUTPATIENT_CLINIC_OR_DEPARTMENT_OTHER)
Admission: RE | Admit: 2021-10-05 | Discharge: 2021-10-05 | Disposition: A | Discharge status: Home / Self Care | Payer: No Typology Code available for payment source | Source: Ambulatory Visit | Attending: Family | Admitting: Family

## 2021-10-05 ENCOUNTER — Other Ambulatory Visit: Payer: Self-pay

## 2021-10-05 ENCOUNTER — Other Ambulatory Visit (INDEPENDENT_AMBULATORY_CARE_PROVIDER_SITE_OTHER): Payer: No Typology Code available for payment source

## 2021-10-05 ENCOUNTER — Encounter (HOSPITAL_BASED_OUTPATIENT_CLINIC_OR_DEPARTMENT_OTHER): Payer: Self-pay

## 2021-10-05 DIAGNOSIS — Z1231 Encounter for screening mammogram for malignant neoplasm of breast: Secondary | ICD-10-CM | POA: Insufficient documentation

## 2021-10-05 DIAGNOSIS — Z0000 Encounter for general adult medical examination without abnormal findings: Secondary | ICD-10-CM | POA: Insufficient documentation

## 2021-10-05 DIAGNOSIS — R7989 Other specified abnormal findings of blood chemistry: Secondary | ICD-10-CM | POA: Diagnosis not present

## 2021-10-05 LAB — HEPATIC FUNCTION PANEL
ALT: 34 U/L (ref 0–35)
AST: 28 U/L (ref 0–37)
Albumin: 4.5 g/dL (ref 3.5–5.2)
Alkaline Phosphatase: 110 U/L (ref 39–117)
Bilirubin, Direct: 0.1 mg/dL (ref 0.0–0.3)
Total Bilirubin: 0.4 mg/dL (ref 0.2–1.2)
Total Protein: 7.3 g/dL (ref 6.0–8.3)

## 2021-10-05 LAB — IRON: Iron: 118 ug/dL (ref 42–145)

## 2021-10-05 LAB — GAMMA GT: GGT: 48 U/L (ref 7–51)

## 2021-10-05 LAB — FERRITIN: Ferritin: 27.7 ng/mL (ref 10.0–291.0)

## 2021-10-06 LAB — HEPATITIS PANEL, ACUTE
Hep A IgM: NONREACTIVE
Hep B C IgM: NONREACTIVE
Hepatitis B Surface Ag: NONREACTIVE
Hepatitis C Ab: NONREACTIVE
SIGNAL TO CUT-OFF: 0.01 (ref <1.00)

## 2021-10-06 LAB — IRON AND TIBC
Iron Saturation: 31 % (ref 15–55)
Iron: 114 ug/dL (ref 27–159)
Total Iron Binding Capacity: 370 ug/dL (ref 250–450)
UIBC: 256 ug/dL (ref 131–425)

## 2021-12-25 ENCOUNTER — Encounter: Payer: Self-pay | Admitting: Gastroenterology

## 2022-02-05 ENCOUNTER — Ambulatory Visit (AMBULATORY_SURGERY_CENTER): Payer: No Typology Code available for payment source | Admitting: *Deleted

## 2022-02-05 ENCOUNTER — Other Ambulatory Visit: Payer: Self-pay

## 2022-02-05 VITALS — Ht 61.0 in | Wt 121.0 lb

## 2022-02-05 DIAGNOSIS — Z1211 Encounter for screening for malignant neoplasm of colon: Secondary | ICD-10-CM

## 2022-02-05 MED ORDER — NA SULFATE-K SULFATE-MG SULF 17.5-3.13-1.6 GM/177ML PO SOLN
2.0000 | Freq: Once | ORAL | 0 refills | Status: AC
Start: 2022-02-05 — End: 2022-02-05

## 2022-02-05 NOTE — Progress Notes (Signed)

## 2022-02-17 ENCOUNTER — Encounter: Payer: Self-pay | Admitting: Gastroenterology

## 2022-02-19 ENCOUNTER — Encounter: Payer: Self-pay | Admitting: Gastroenterology

## 2022-02-19 ENCOUNTER — Other Ambulatory Visit: Payer: Self-pay

## 2022-02-19 ENCOUNTER — Ambulatory Visit (AMBULATORY_SURGERY_CENTER): Payer: No Typology Code available for payment source | Admitting: Gastroenterology

## 2022-02-19 VITALS — BP 104/61 | HR 97 | Temp 97.7°F | Resp 23 | Ht 61.0 in | Wt 121.0 lb

## 2022-02-19 DIAGNOSIS — K621 Rectal polyp: Secondary | ICD-10-CM

## 2022-02-19 DIAGNOSIS — Z1211 Encounter for screening for malignant neoplasm of colon: Secondary | ICD-10-CM | POA: Diagnosis present

## 2022-02-19 DIAGNOSIS — D127 Benign neoplasm of rectosigmoid junction: Secondary | ICD-10-CM

## 2022-02-19 DIAGNOSIS — D128 Benign neoplasm of rectum: Secondary | ICD-10-CM

## 2022-02-19 DIAGNOSIS — K635 Polyp of colon: Secondary | ICD-10-CM | POA: Diagnosis not present

## 2022-02-19 MED ORDER — SODIUM CHLORIDE 0.9 % IV SOLN: 500.0000 mL | Freq: Once | INTRAVENOUS | Status: DC

## 2022-02-19 NOTE — Op Note (Signed)
[facility] Patient Name: Diana Norris Procedure Date: 02/19/2022 9:20 AM MRN: [id number] Endoscopist: Gerrit Heck , MD Age: 56 Referring MD:  Date of Birth: 09/08/1966 Gender: Female Account #: [id number] Procedure:                Colonoscopy Indications:              Screening for colorectal malignant neoplasm, This                            is the patient's first colonoscopy Medicines:                Monitored Anesthesia Care Procedure:                Pre-Anesthesia Assessment:                           - Prior to the procedure, a History and Physical                            was performed, and patient medications and                            allergies were reviewed. The patient's tolerance of                            previous anesthesia was also reviewed. The risks                            and benefits of the procedure and the sedation                            options and risks were discussed with the patient.                            All questions were answered, and informed consent                            was obtained. Prior Anticoagulants: The patient has                            taken no previous anticoagulant or antiplatelet                            agents. ASA Grade Assessment: II - A patient with                            mild systemic disease. After reviewing the risks                            and benefits, the patient was deemed in                            satisfactory condition to undergo the procedure.  After obtaining informed consent, the colonoscope                            was passed under direct vision. Throughout the                            procedure, the patient's blood pressure, pulse, and                            oxygen saturations were monitored continuously. The                            Olympus CF-HQ190L [id number]) Colonoscope was                            introduced through the anus  and advanced to the the                            cecum, identified by appendiceal orifice and                            ileocecal valve. The colonoscopy was performed                            without difficulty. The patient tolerated the                            procedure well. The quality of the bowel                            preparation was excellent. The ileocecal valve,                            appendiceal orifice, and rectum were photographed. Scope In: 9:32:56 AM Scope Out: 9:50:30 AM Scope Withdrawal Time: 0 hours 12 minutes 44 seconds  Total Procedure Duration: 0 hours 17 minutes 34 seconds  Findings:                 The perianal and digital rectal examinations were                            normal.                           Two sessile polyps were found in the rectum and                            recto-sigmoid colon. The polyps were 2 to 3 mm in                            size. These polyps were removed with a cold snare.                            Resection and retrieval were complete. Estimated  blood loss was minimal.                           The exam was otherwise normal throughout the                            remainder of the colon.                           Retroflexion in the rectum was not performed due to                            anatomy (narrow and shallow rectal vault).                            Anterograde views were otherwise normal appearing. Complications:            No immediate complications. Estimated Blood Loss:     Estimated blood loss was minimal. Impression:               - Two 2 to 3 mm polyps in the rectum and at the                            recto-sigmoid colon, removed with a cold snare.                            Resected and retrieved.                           - Otherwise, normal appearing colon. Recommendation:           - Patient has a contact number available for                             emergencies. The signs and symptoms of potential                            delayed complications were discussed with the                            patient. Return to normal activities tomorrow.                            Written discharge instructions were provided to the                            patient.                           - Resume previous diet.                           - Continue present medications.                           - Await pathology results.                           -  Repeat colonoscopy in 5-10 years for surveillance                            based on pathology results.                           - Return to GI office PRN. Gerrit Heck, MD 02/19/2022 9:55:06 AM

## 2022-02-19 NOTE — Progress Notes (Signed)
GASTROENTEROLOGY PROCEDURE H&P NOTE   Primary Care Physician: Debbrah Alar, NP    Reason for Procedure:  Colon Cancer screening  Plan:    Colonoscopy  Patient is appropriate for endoscopic procedure(s) in the ambulatory ([facility]) setting.  The nature of the procedure, as well as the risks, benefits, and alternatives were carefully and thoroughly reviewed with the patient. Ample time for discussion and questions allowed. The patient understood, was satisfied, and agreed to proceed.     HPI: Diana Norris is a 56 y.o. female who presents for colonoscopy for routine Colon Cancer screening.  No active GI symptoms.  No known family history of colon cancer or related malignancy.  Patient is otherwise without complaints or active issues today.  Past Medical History:  Diagnosis Date   Anemia     Past Surgical History:  Procedure Laterality Date   CESAREAN SECTION     x 2   EYE SURGERY Left    L Ptergium removal    Prior to Admission medications   Medication Sig Start Date End Date Taking? Authorizing Provider  cholecalciferol (VITAMIN D3) 25 MCG (1000 UNIT) tablet Take 1,000 Units by mouth daily.    [provider]    Current Outpatient Medications  Medication Sig Dispense Refill   cholecalciferol (VITAMIN D3) 25 MCG (1000 UNIT) tablet Take 1,000 Units by mouth daily.     Current Facility-Administered Medications  Medication Dose Route Frequency Provider Last Rate Last Admin   0.9 %  sodium chloride infusion  500 mL Intravenous Once Kuba Shepherd V, DO        Allergies as of 02/19/2022   (No Known Allergies)    Family History  Problem Relation Age of Onset   Hypertension Mother        arthritis, vertigo   Hypertension Father    Diabetes Father        renal insufficiency   Hyperlipidemia Father    Hypertension Sister    Cancer Maternal Grandmother        breast?   Heart disease Neg Hx    Kidney disease Neg Hx    Colon cancer Neg Hx    Colon  polyps Neg Hx    Esophageal cancer Neg Hx    Stomach cancer Neg Hx    Rectal cancer Neg Hx     Social History   Socioeconomic History   Marital status: Married    Spouse name: Not on file   Number of children: Not on file   Years of education: Not on file   Highest education level: Not on file  Occupational History   Not on file  Tobacco Use   Smoking status: Never   Smokeless tobacco: Never  Vaping Use   Vaping Use: Never used  Substance and Sexual Activity   Alcohol use: No   Drug use: Never   Sexual activity: Yes    Partners: Male    Birth control/protection: None  Other Topics Concern   Not on file  Social History Narrative   MarriedHas bachelor degree, 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) [place]   3 children 5 son, 53 and 49 daughters   Enjoys dancing, spending time with kids   Social Determinants of Radio broadcast assistant Strain: Not on file  Food Insecurity: Not on file  Transportation Needs: Not on file  Physical Activity: Not on file  Stress: Not on file  Social Connections: Not on file  Intimate Partner Violence: Not on file  Physical Exam: Vital signs in last 24 hours: @BP  107/62    Pulse 70    Temp 97.7 F (36.5 C)    Resp 11    Ht 5\' 1"  (1.549 m)    Wt 121 lb (54.9 kg)    LMP 06/26/2010    SpO2 100%    BMI 22.86 kg/m  GEN: NAD EYE: Sclerae anicteric ENT: MMM CV: Non-tachycardic Pulm: CTA b/l GI: Soft, NT/ND NEURO:  Alert & Oriented x 3   Gerrit Heck, DO [facility]   02/19/2022 9:27 AM

## 2022-02-19 NOTE — Patient Instructions (Signed)
Information on polyps given to you today.  Await pathology results.  Resume previous diet and medications.  YOU HAD AN ENDOSCOPIC PROCEDURE TODAY AT THE [facility]:   Refer to the procedure report that was given to you for any specific questions about what was found during the examination.  If the procedure report does not answer your questions, please call your gastroenterologist to clarify.  If you requested that your care partner not be given the details of your procedure findings, then the procedure report has been included in a sealed envelope for you to review at your convenience later.  YOU SHOULD EXPECT: Some feelings of bloating in the abdomen. Passage of more gas than usual.  Walking can help get rid of the air that was put into your GI tract during the procedure and reduce the bloating. If you had a lower endoscopy (such as a colonoscopy or flexible sigmoidoscopy) you may notice spotting of blood in your stool or on the toilet paper. If you underwent a bowel prep for your procedure, you may not have a normal bowel movement for a few days.  Please Note:  You might notice some irritation and congestion in your nose or some drainage.  This is from the oxygen used during your procedure.  There is no need for concern and it should clear up in a day or so.  SYMPTOMS TO REPORT IMMEDIATELY:   Following lower endoscopy (colonoscopy or flexible sigmoidoscopy):  Excessive amounts of blood in the stool  Significant tenderness or worsening of abdominal pains  Swelling of the abdomen that is new, acute  Fever of 100F or higher   For urgent or emergent issues, a gastroenterologist can be reached at any hour by calling [phone]. Do not use MyChart messaging for urgent concerns.    DIET:  We do recommend a small meal at first, but then you may proceed to your regular diet.  Drink plenty of fluids but you should avoid alcoholic beverages for 24 hours.  ACTIVITY:  You should  plan to take it easy for the rest of today and you should NOT DRIVE or use heavy machinery until tomorrow (because of the sedation medicines used during the test).    FOLLOW UP: Our staff will call the number listed on your records 48-72 hours following your procedure to check on you and address any questions or concerns that you may have regarding the information given to you following your procedure. If we do not reach you, we will leave a message.  We will attempt to reach you two times.  During this call, we will ask if you have developed any symptoms of COVID 19. If you develop any symptoms (ie: fever, flu-like symptoms, shortness of breath, cough etc.) before then, please call [phone].  If you test positive for Covid 19 in the 2 weeks post procedure, please call and report this information to us.    If any biopsies were taken you will be contacted by phone or by letter within the next 1-3 weeks.  Please call us at [phone] if you have not heard about the biopsies in 3 weeks.    SIGNATURES/CONFIDENTIALITY: You and/or your care partner have signed paperwork which will be entered into your electronic medical record.  These signatures attest to the fact that that the information above on your After Visit Summary has been reviewed and is understood.  Full responsibility of the confidentiality of this discharge information lies with you and/or your care-partner. 

## 2022-02-19 NOTE — Progress Notes (Signed)
Called to room to assist during endoscopic procedure.  Patient ID and intended procedure confirmed with present staff. Received instructions for my participation in the procedure from the performing physician.  

## 2022-02-19 NOTE — Progress Notes (Signed)
D.T. vital signs. °

## 2022-02-19 NOTE — Progress Notes (Signed)
Pt's states no medical or surgical changes since previsit or office visit. 

## 2022-02-19 NOTE — Progress Notes (Signed)
Report to PACU, RN, vss, BBS= Clear.  

## 2022-02-23 ENCOUNTER — Telehealth: Payer: Self-pay

## 2022-02-23 NOTE — Telephone Encounter (Signed)
°  Follow up Call-  Call back number 02/19/2022  Post procedure Call Back phone  # [phone]  Permission to leave phone message Yes  Some recent data might be hidden     Patient questions:  Do you have a fever, pain , or abdominal swelling? No. Pain Score  0 *  Have you tolerated food without any problems? Yes.    Have you been able to return to your normal activities? Yes.    Do you have any questions about your discharge instructions: Diet   No. Medications  No. Follow up visit  No.  Do you have questions or concerns about your Care? No.  Actions: * If pain score is 4 or above: No action needed, pain <4.

## 2022-02-24 ENCOUNTER — Encounter: Payer: Self-pay | Admitting: Gastroenterology

## 2022-09-03 ENCOUNTER — Encounter: Payer: Self-pay | Admitting: Family

## 2022-09-03 ENCOUNTER — Telehealth: Payer: Self-pay | Admitting: Family

## 2022-09-03 ENCOUNTER — Ambulatory Visit (INDEPENDENT_AMBULATORY_CARE_PROVIDER_SITE_OTHER): Payer: 59 | Admitting: Family

## 2022-09-03 VITALS — BP 127/87 | HR 89 | Temp 98.5°F | Resp 18 | Ht 61.0 in | Wt 126.6 lb

## 2022-09-03 DIAGNOSIS — Z0000 Encounter for general adult medical examination without abnormal findings: Secondary | ICD-10-CM

## 2022-09-03 DIAGNOSIS — R7989 Other specified abnormal findings of blood chemistry: Secondary | ICD-10-CM

## 2022-09-03 DIAGNOSIS — L989 Disorder of the skin and subcutaneous tissue, unspecified: Secondary | ICD-10-CM | POA: Diagnosis not present

## 2022-09-03 DIAGNOSIS — M79602 Pain in left arm: Secondary | ICD-10-CM | POA: Diagnosis not present

## 2022-09-03 DIAGNOSIS — G8929 Other chronic pain: Secondary | ICD-10-CM | POA: Insufficient documentation

## 2022-09-03 LAB — COMPREHENSIVE METABOLIC PANEL
ALT: 39 U/L — ABNORMAL HIGH (ref 0–35)
AST: 42 U/L — ABNORMAL HIGH (ref 0–37)
Albumin: 4.1 g/dL (ref 3.5–5.2)
Alkaline Phosphatase: 101 U/L (ref 39–117)
BUN: 16 mg/dL (ref 6–23)
CO2: 30 mEq/L (ref 19–32)
Calcium: 9.3 mg/dL (ref 8.4–10.5)
Chloride: 104 mEq/L (ref 96–112)
Creatinine, Ser: 0.72 mg/dL (ref 0.40–1.20)
GFR: 94.24 mL/min (ref >60.00)
Glucose, Bld: 77 mg/dL (ref 70–99)
Potassium: 5.2 mEq/L — ABNORMAL HIGH (ref 3.5–5.1)
Sodium: 139 mEq/L (ref 135–145)
Total Bilirubin: 0.4 mg/dL (ref 0.2–1.2)
Total Protein: 6.9 g/dL (ref 6.0–8.3)

## 2022-09-03 MED ORDER — MELOXICAM 7.5 MG PO TABS: 7.5000 mg | ORAL_TABLET | Freq: Every day | ORAL | 0 refills | Status: DC

## 2022-09-03 NOTE — Progress Notes (Signed)
Subjective:   By signing my name below, I, Carylon Perches, attest that this documentation has been prepared under the direction and in the presence of [place], NP 09/03/2022   Patient ID: Diana Norris, female    DOB: November 27, 1966, 56 y.o.   MRN: [id number]  Chief Complaint  Patient presents with   Annual Exam    HPI Patient is in today for a comprehensive physical exam   Left Arm Pain: She complains of left arm pain that appeared about 3 weeks ago. She noticed that when she lifts her arm up a certain type of way, she feels a sharp pain under her biceps that radiates up her left shoulder. She also has a persistent dull pain in the area. Associated symptoms include headaches. She notes that she does sleep on that side of her arm.  Night Sweats: She states that she gets night sweats due to menopause. The symptoms are manageable.  Mole Under Right Ear: She is requesting to get the mole under her right ear to be removed.  Liver: Her liver panels are abnormal. She went to a facility to get retested and reports that levels are now normal Lab Results  Component Value Date   ALT 34 10/05/2021   AST 28 10/05/2021   ALKPHOS 110 10/05/2021   BILITOT 0.4 10/05/2021    She denies having any fever, new muscle pain, new moles, rashes, congestion, sinus pain, sore throat, palpations, cough, SOB ,wheezing,n/v/d constipation, blood in stool, dysuria, frequency, hematuria, depression, anxiety, at this time  Social history: She reports no recent surgeries. She denies of any changes to her family medical history.  Colonoscopy: Last completed on 02/19/2022 Dexa: Last completed on 06/26/2018 Pap Smear: Last completed on 09/02/2021 Mammogram: Last completed on 10/05/2021 Immunizations: She is UTD on her tetanus vaccine. She is not interested in receiving the Shingles vaccine or Influenza vaccine during today's visit.  Diet: She is maintaining a healthy diet. However, she has been consuming  more sugar than usual.  Exercise: She is not regularly exercising.  Dental: She is not UTD on her dental exams  Vision: She is UTD on vision exams   Health Maintenance Due  Topic Date Due   Zoster Vaccines- Shingrix (1 of 2) Never done   COVID-19 Vaccine (3 - Pfizer series) 09/08/2020   INFLUENZA VACCINE  07/27/2022    Past Medical History:  Diagnosis Date   Anemia     Past Surgical History:  Procedure Laterality Date   CESAREAN SECTION     x 2   EYE SURGERY Left    L Ptergium removal    Family History  Problem Relation Age of Onset   Hypertension Mother        arthritis, vertigo   Hypertension Father    Diabetes Father        renal insufficiency   Hyperlipidemia Father    Hypertension Sister    Cancer Maternal Grandmother        breast?   Heart disease Neg Hx    Kidney disease Neg Hx    Colon cancer Neg Hx    Colon polyps Neg Hx    Esophageal cancer Neg Hx    Stomach cancer Neg Hx    Rectal cancer Neg Hx     Social History   Socioeconomic History   Marital status: Married    Spouse name: Not on file   Number of children: Not on file   Years of education: Not on file  Highest education level: Not on file  Occupational History   Not on file  Tobacco Use   Smoking status: Never   Smokeless tobacco: Never  Vaping Use   Vaping Use: Never used  Substance and Sexual Activity   Alcohol use: No   Drug use: Never   Sexual activity: Yes    Partners: Male    Birth control/protection: None  Other Topics Concern   Not on file  Social History Narrative   MarriedHas bachelor degree, 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     No facility-administered medications prior to visit.    No Known Allergies  Review of Systems  Constitutional:  Negative for fever.  HENT:  Negative for congestion, sinus pain and sore throat.   Respiratory:  Negative for cough, shortness of breath and wheezing.   Cardiovascular:  Negative for palpitations.  Gastrointestinal:  Negative for blood in stool, constipation, diarrhea, nausea and vomiting.  Genitourinary:  Negative for dysuria, frequency and hematuria.  Musculoskeletal:  Positive for joint pain (Left Arm). Negative for myalgias.  Skin:  Negative for rash.       (-) New Moles  Neurological:  Positive for headaches.  Psychiatric/Behavioral:  Negative for depression. The patient is not nervous/anxious.        Objective:    Physical Exam Constitutional:      General: She is not in acute distress.    Appearance: Normal appearance. She is not ill-appearing.  HENT:     Head: Normocephalic and atraumatic.     Right Ear: Tympanic membrane, ear canal and external ear normal.     Left Ear: Tympanic membrane, ear canal and external ear normal.     Mouth/Throat:     Pharynx: No oropharyngeal exudate.  Eyes:     Extraocular Movements: Extraocular movements intact.     Pupils: Pupils are equal, round, and reactive to light.  Cardiovascular:     Rate and Rhythm: Normal rate and regular rhythm.     Heart sounds: Normal heart sounds. No murmur heard.    No gallop.  Pulmonary:     Effort: Pulmonary effort is normal. No respiratory distress.     Breath sounds: Normal breath sounds. No wheezing or rales.  Abdominal:     General: Bowel sounds are normal. There is no distension.     Palpations: Abdomen is soft.     Tenderness: There is no abdominal tenderness. There is no guarding.   Musculoskeletal:     Comments: 5/5 strength in both upper and lower extremities    Skin:    General: Skin is warm and dry.     Comments: Pedunculated lesion right cheek in front of right ear.   Neurological:     Mental Status: She is alert and oriented to person, place, and time.     Deep Tendon Reflexes:     Reflex Scores:  Patellar reflexes are 2+ on the right side and 2+ on the left side. Psychiatric:        Mood and Affect: Mood normal.        Behavior: Behavior normal.        Judgment: Judgment normal.     BP 127/87   Pulse 89   Temp 98.5 F (36.9 C)   Resp 18   Ht 5' 1"  (1.549 m)   Wt 126 lb 9.6 oz (57.4 kg)   LMP 06/26/2010   SpO2 99%   BMI 23.92 kg/m  Wt Readings from Last 3 Encounters:  09/03/22 126 lb 9.6 oz (57.4 kg)  02/19/22 121 lb (54.9 kg)  02/05/22 121 lb (54.9 kg)       Assessment & Plan:   Problem List Items Addressed This Visit       Unprioritized   Skin lesion of cheek    Desires removal. Refer to dermatology.       Relevant Orders   Ambulatory referral to Dermatology   Routine general medical examination at a health care facility - Primary    Wt Readings from Last 3 Encounters:  09/03/22 126 lb 9.6 oz (57.4 kg)  02/19/22 121 lb (54.9 kg)  02/05/22 121 lb (54.9 kg)  Refer for mammo. Declines shingrix flu shot. Encouraged exercise. Pap up to date      Pain of left upper extremity    New. Will give trial of meloxicam. She will let me know if pain does not improve .      Relevant Medications   meloxicam (MOBIC) 7.5 MG tablet   Other Visit Diagnoses     Preventative health care       Relevant Orders   MM 3D SCREEN BREAST BILATERAL   Abnormal LFTs       Relevant Orders   Comp Met (CMET)      Meds ordered this encounter  Medications   meloxicam (MOBIC) 7.5 MG tablet    Sig: Take 1 tablet (7.5 mg total) by mouth daily.    Dispense:  14 tablet    Refill:  0    Order Specific Question:   Supervising Provider    Answer:    Penni Homans A [[id number]]    I, Nance Pear, NP, personally preformed the services described in this documentation.  All medical record entries made by the scribe were at my direction and in my presence.  I have reviewed the chart and discharge instructions (if applicable) and agree that the record reflects my personal performance and is accurate and complete. 09/03/2022   I,Amber Collins,acting as a scribe for Nance Pear, NP.,have documented all relevant documentation on the behalf of Nance Pear, NP,as directed by  Nance Pear, NP while in the presence of Nance Pear, NP.    Nance Pear, NP

## 2022-09-03 NOTE — Telephone Encounter (Signed)
Please advise pt that her liver function testing is still mildly elevated.  I would like for her to meet with GI to further evaluate.   Also, potassium was slightly elevated. If she is taking any dietary supplements, please discontinue.

## 2022-09-03 NOTE — Assessment & Plan Note (Signed)
New. Will give trial of meloxicam. She will let me know if pain does not improve .

## 2022-09-03 NOTE — Assessment & Plan Note (Addendum)
Wt Readings from Last 3 Encounters:  09/03/22 126 lb 9.6 oz (57.4 kg)  02/19/22 121 lb (54.9 kg)  02/05/22 121 lb (54.9 kg)   Refer for mammo. Declines shingrix flu shot. Encouraged exercise. Pap up to date

## 2022-09-03 NOTE — Telephone Encounter (Signed)
Patient advised of results and provider's recommendations to follow up with GI.  She reports she does not take potassium supplements but she does eat bananas daily and other potassium rich foods. Patient advised to decrease consumption.

## 2022-09-03 NOTE — Assessment & Plan Note (Signed)
Desires removal. Refer to dermatology.

## 2022-09-08 IMAGING — US US ABDOMEN COMPLETE
2 series · 14 of 25 positions shown · non-contrast
Comparison: None.

CLINICAL DATA: Elevated liver function tests.

EXAM:
ABDOMEN ULTRASOUND COMPLETE

[Series 1: us abdomen complete · 13 of 75 slices shown (1 of 2)]
[im 1/75]
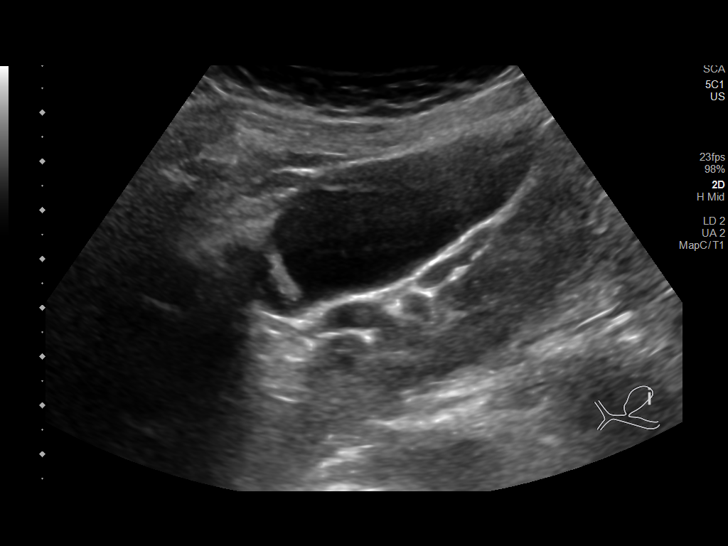
[im 7/75]
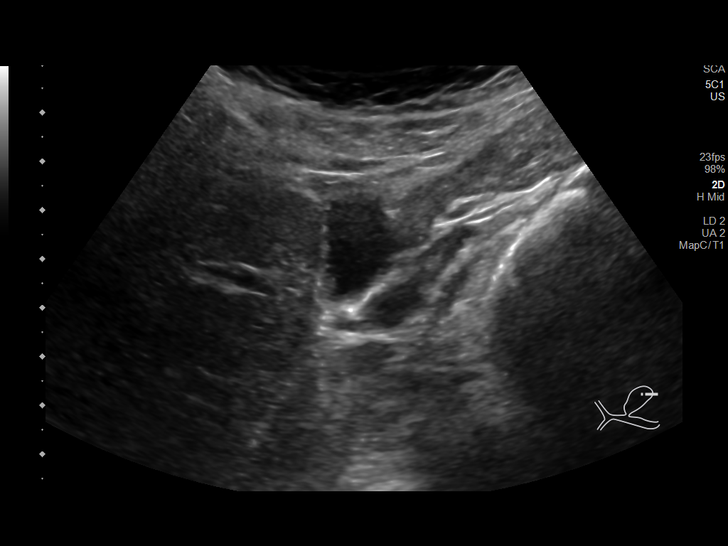
[im 13/75]
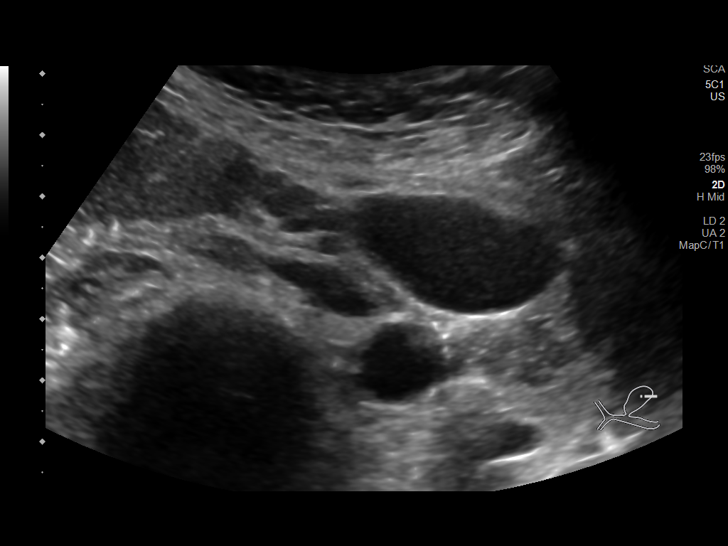
[im 20/75]
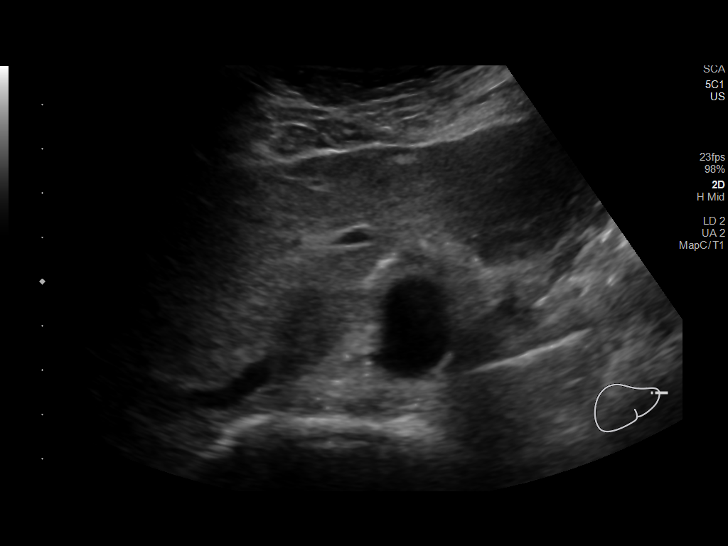
[im 26/75]
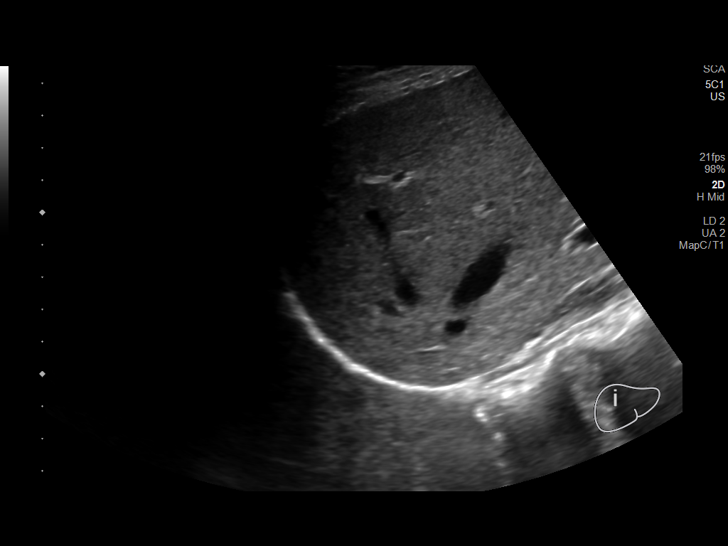
[im 29/75]
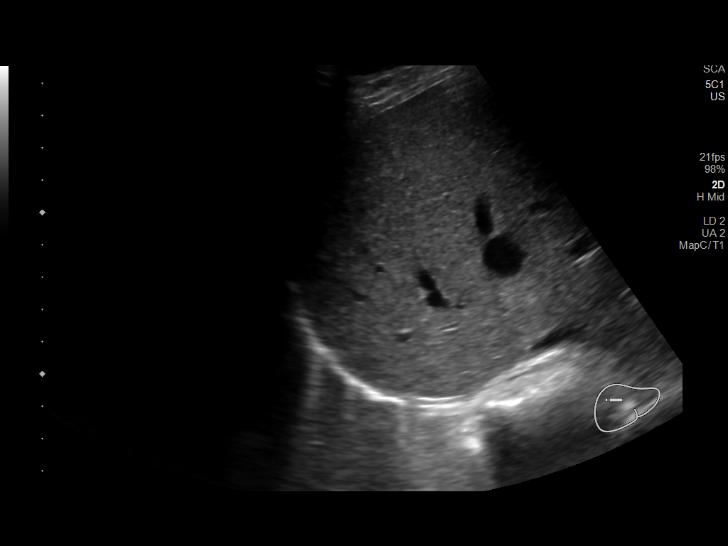
[im 36/75]
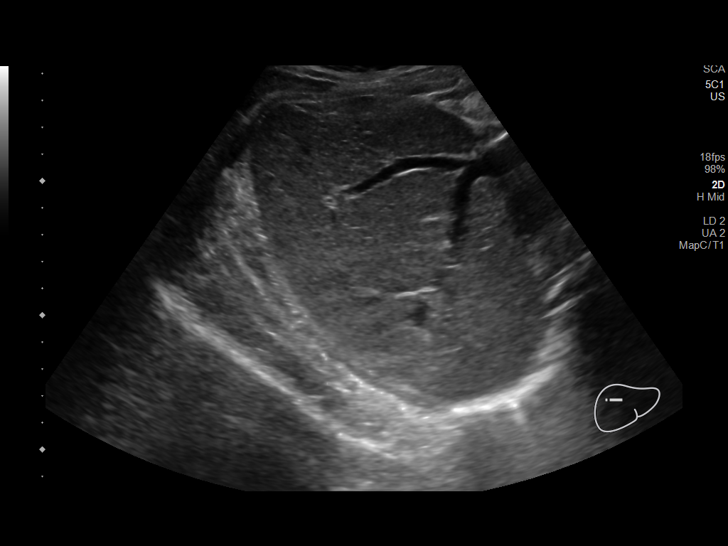
[im 42/75]
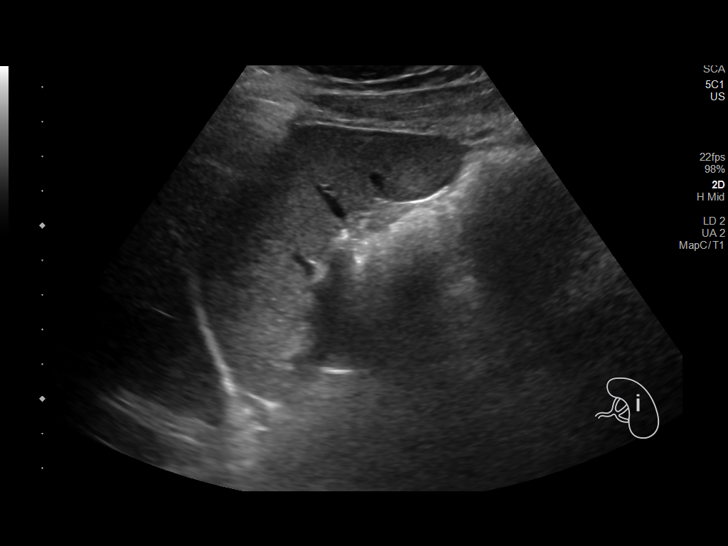
[im 49/75]
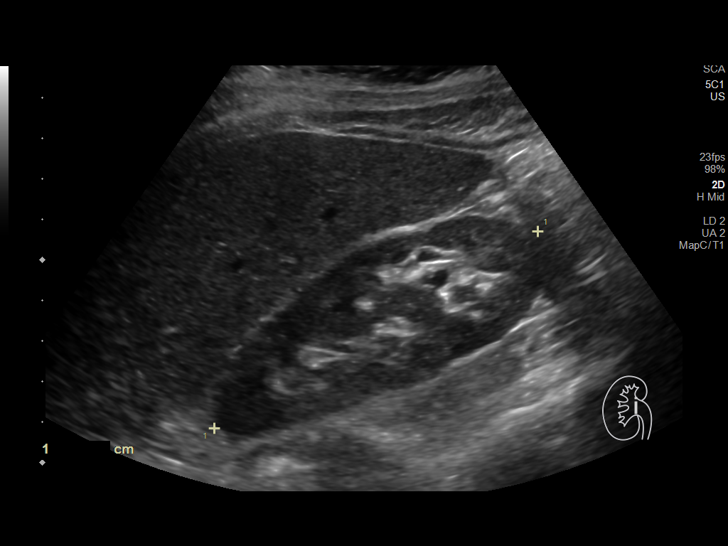
[im 52/75]
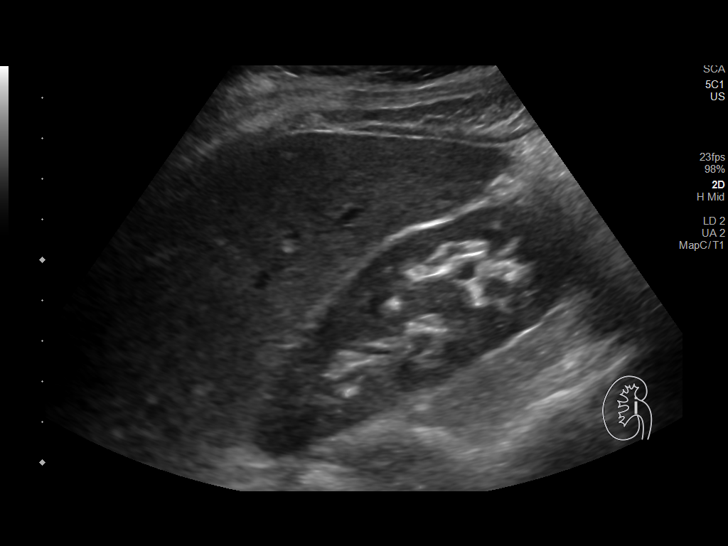
[im 58/75]
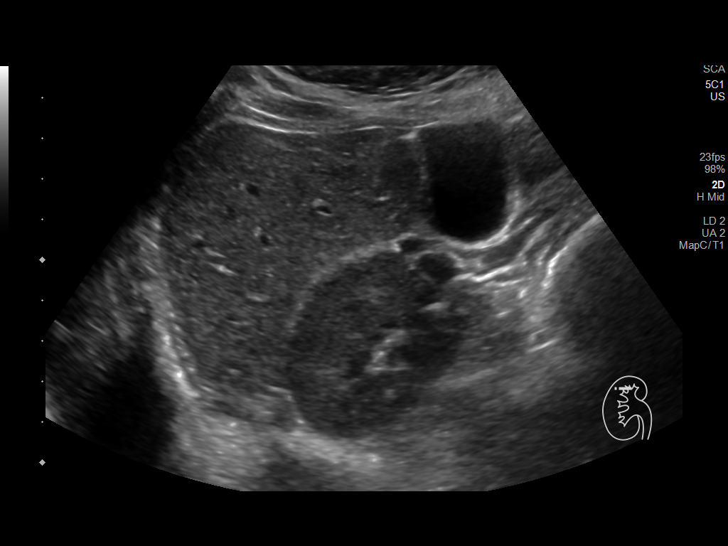
[im 65/75]
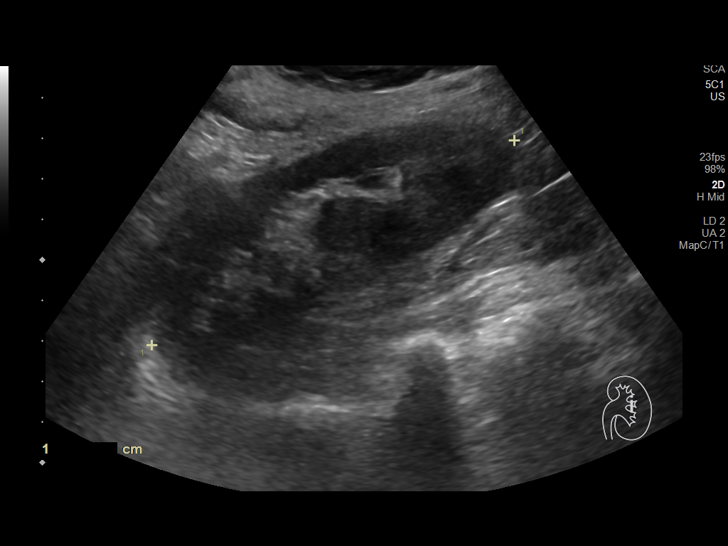
[im 71/75]
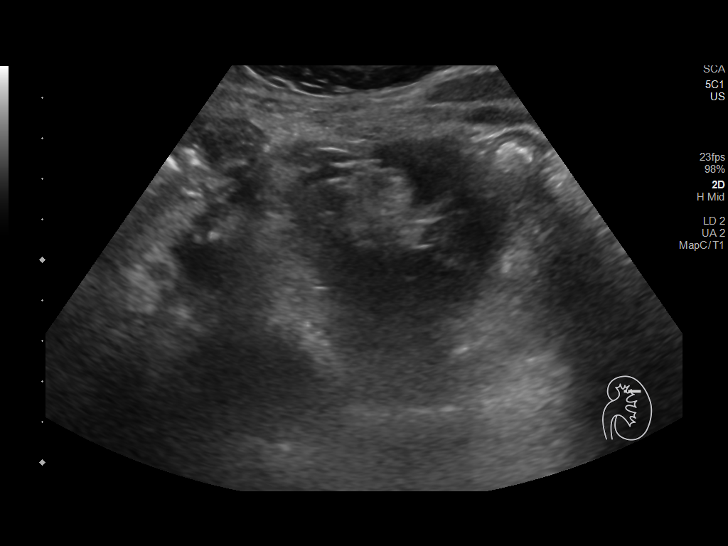

[Series 2: us abdomen complete · 1 of 1 slices shown (2 of 2)]
[im 1/1]
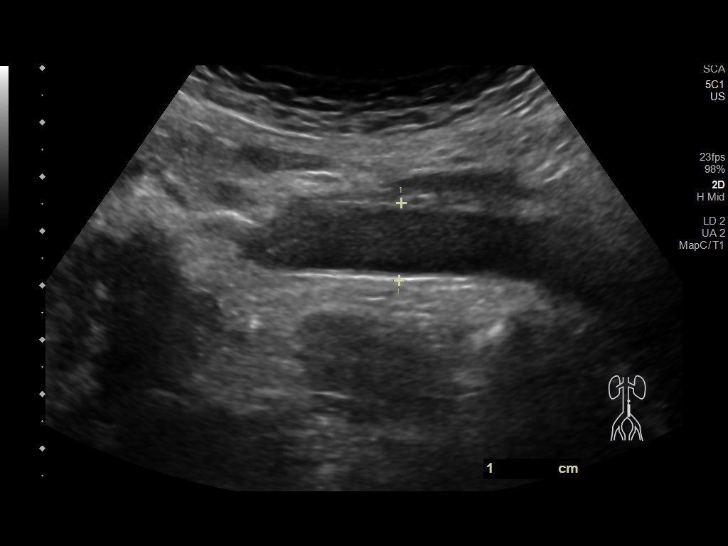

[14 of 25 positions shown; findings below may reference images not displayed]

FINDINGS: Gallbladder: No gallstones or wall thickening visualized (0.9 mm).
No sonographic Murphy sign noted by sonographer.

Common bile duct: Diameter: 2.5 mm

Liver: No focal lesion identified. Within normal limits in
parenchymal echogenicity. Portal vein is patent on color Doppler
imaging with normal direction of blood flow towards the liver.

IVC: No abnormality visualized.

Pancreas: Visualized portion unremarkable.

Spleen: Size (7.5 cm) and appearance within normal limits.

Right Kidney: Length: 9.4 cm. Echogenicity within normal limits. No
mass or hydronephrosis visualized.

Left Kidney: Length: 10.3 cm. Echogenicity within normal limits. No
mass or hydronephrosis visualized.

Abdominal aorta: No aneurysm visualized (1.4 cm in AP diameter).

Other findings: None.
IMPRESSION: Normal abdominal ultrasound.

## 2022-10-12 ENCOUNTER — Ambulatory Visit (HOSPITAL_BASED_OUTPATIENT_CLINIC_OR_DEPARTMENT_OTHER)
Admission: RE | Admit: 2022-10-12 | Discharge: 2022-10-12 | Disposition: A | Discharge status: Home / Self Care | Payer: 59 | Source: Ambulatory Visit | Attending: Family | Admitting: Family

## 2022-10-12 ENCOUNTER — Encounter (HOSPITAL_BASED_OUTPATIENT_CLINIC_OR_DEPARTMENT_OTHER): Payer: Self-pay

## 2022-10-12 DIAGNOSIS — Z1231 Encounter for screening mammogram for malignant neoplasm of breast: Secondary | ICD-10-CM | POA: Insufficient documentation

## 2022-10-12 DIAGNOSIS — Z0000 Encounter for general adult medical examination without abnormal findings: Secondary | ICD-10-CM | POA: Diagnosis not present

## 2023-05-24 ENCOUNTER — Ambulatory Visit: Payer: PRIVATE HEALTH INSURANCE | Admitting: Family

## 2023-05-24 NOTE — Progress Notes (Shared)
Subjective:   By signing my name below, I, Doylene Bode, attest that this documentation has been prepared under the direction and in the presence of Lemont Fillers, NP 05/24/23   Patient ID: Diana Norris, female    DOB: 1966-09-03, 57 y.o.   MRN: [id number]  No chief complaint on file.   HPI Patient is in today for bump on inner left thigh, left shoulder pain.  Bump left thigh medial aspect: first noticed on 05/17/23 and has not decreased in size. It is tender.  Left shoulder pain: having pain upon raising her left shoulder. This has been a problem for 6 months.  Past Medical History:  Diagnosis Date   Anemia     Past Surgical History:  Procedure Laterality Date   CESAREAN SECTION     x 2   EYE SURGERY Left    L Ptergium removal    Family History  Problem Relation Age of Onset   Hypertension Mother        arthritis, vertigo   Hypertension Father    Diabetes Father        renal insufficiency   Hyperlipidemia Father    Hypertension Sister    Cancer Maternal Grandmother        breast?   Heart disease Neg Hx    Kidney disease Neg Hx    Colon cancer Neg Hx    Colon polyps Neg Hx    Esophageal cancer Neg Hx    Stomach cancer Neg Hx    Rectal cancer Neg Hx     Social History   Socioeconomic History   Marital status: Married    Spouse name: Not on file   Number of children: Not on file   Years of education: Not on file   Highest education level: Bachelor's degree (e.g., BA, AB, BS)  Occupational History   Not on file  Tobacco Use   Smoking status: Never   Smokeless tobacco: Never  Vaping Use   Vaping Use: Never used  Substance and Sexual Activity   Alcohol use: No   Drug use: Never   Sexual activity: Yes    Partners: Male    Birth control/protection: None  Other Topics Concern   Not on file  Social History Narrative   MarriedHas bachelor degree, 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)    [employer]   3 children 26son, 22 and 20 daughters    Enjoys dancing, spending time with kids   Social Determinants of Health   Financial Resource Strain: Low Risk  (05/21/2023)   Overall Financial Resource Strain (CARDIA)    Difficulty of Paying Living Expenses: Not hard at all  Food Insecurity: No Food Insecurity (05/21/2023)   Hunger Vital Sign    Worried About Running Out of Food in the Last Year: Never true    Ran Out of Food in the Last Year: Never true  Transportation Needs: No Transportation Needs (05/21/2023)   PRAPARE - Administrator, Civil Service (Medical): No    Lack of Transportation (Non-Medical): No  Physical Activity: Unknown (05/21/2023)   Exercise Vital Sign    Days of Exercise per Week: 0 days    Minutes of Exercise per Session: Not on file  Stress: No Stress Concern Present (05/21/2023)   [employer] of Occupational Health - Occupational Stress Questionnaire    Feeling of Stress : Not at all  Social Connections: Socially Integrated (05/21/2023)   Social Connection and Isolation Panel [NHANES]  Frequency of Communication with Friends and Family: More than three times a week    Frequency of Social Gatherings with Friends and Family: Twice a week    Attends Religious Services: More than 4 times per year    Active Member of [employer] or Organizations: Yes    Attends Engineer, structural: More than 4 times per year    Marital Status: Married  Catering manager Violence: Not on file    Outpatient Medications Prior to Visit  Medication Sig Dispense Refill   cholecalciferol (VITAMIN D3) 25 MCG (1000 UNIT) tablet Take 1,000 Units by mouth daily.     meloxicam (MOBIC) 7.5 MG tablet Take 1 tablet (7.5 mg total) by mouth daily. 14 tablet 0   No facility-administered medications prior to visit.    No Known Allergies  ROS     Objective:    Physical Exam  LMP 06/26/2010  Wt Readings from Last 3 Encounters:  09/03/22 126 lb 9.6 oz (57.4 kg)  02/19/22 121 lb (54.9 kg)  02/05/22 121 lb (54.9  kg)       Assessment & Plan:  There are no diagnoses linked to this encounter.  ***  I,Alexander Ruley,acting as a Neurosurgeon for Lemont Fillers, NP.,have documented all relevant documentation on the behalf of Lemont Fillers, NP,as directed by  Lemont Fillers, NP while in the presence of Lemont Fillers, NP.

## 2023-05-26 NOTE — Progress Notes (Signed)
Subjective:   By signing my name below, I, Diana Norris, attest that this documentation has been prepared under the direction and in the presence of Lemont Fillers, NP 05/27/23   Patient ID: Diana Norris, female    DOB: 1966/10/18, 57 y.o.   MRN: [id number]  Chief Complaint  Patient presents with   Mass    Patient reports having a mass in her left inguinal area   Shoulder Pain    Complains of left shoulder pain    HPI Patient is in today for an office visit.   Mass on thigh: She complains of a mass on her left groin area for about 2 weeks. She reports it has bothered her while sitting. She states it has slightly improved, but not fully resolved. She states she has previously had irritation in the groin area after shaving, but it usually goes away sooner. She denies any fever.  Shoulder pain: She states her left shoulder pain has not resolved. She continues to have a limited range of motion. She has tried warm/hot compress and has gone to PT, which has not helped much. She has not been seen by orthopaedics.   Past Medical History:  Diagnosis Date   Anemia     Past Surgical History:  Procedure Laterality Date   CESAREAN SECTION     x 2   EYE SURGERY Left    L Ptergium removal    Family History  Problem Relation Age of Onset   Hypertension Mother        arthritis, vertigo   Hypertension Father    Diabetes Father        renal insufficiency   Hyperlipidemia Father    Hypertension Sister    Cancer Maternal Grandmother        breast?   Heart disease Neg Hx    Kidney disease Neg Hx    Colon cancer Neg Hx    Colon polyps Neg Hx    Esophageal cancer Neg Hx    Stomach cancer Neg Hx    Rectal cancer Neg Hx     Social History   Socioeconomic History   Marital status: Married    Spouse name: Not on file   Number of children: Not on file   Years of education: Not on file   Highest education level: Bachelor's degree (e.g., BA, AB, BS)  Occupational History    Not on file  Tobacco Use   Smoking status: Never   Smokeless tobacco: Never  Vaping Use   Vaping Use: Never used  Substance and Sexual Activity   Alcohol use: No   Drug use: Never   Sexual activity: Yes    Partners: Male    Birth control/protection: None  Other Topics Concern   Not on file  Social History Narrative   MarriedHas bachelor degree, 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)    [employer]   3 children 26son, 22 and 20 daughters   Enjoys dancing, spending time with kids   Social Determinants of Health   Financial Resource Strain: Low Risk  (05/21/2023)   Overall Financial Resource Strain (CARDIA)    Difficulty of Paying Living Expenses: Not hard at all  Food Insecurity: No Food Insecurity (05/21/2023)   Hunger Vital Sign    Worried About Running Out of Food in the Last Year: Never true    Ran Out of Food in the Last Year: Never true  Transportation Needs: No Transportation Needs (05/21/2023)   PRAPARE - Transportation  Lack of Transportation (Medical): No    Lack of Transportation (Non-Medical): No  Physical Activity: Unknown (05/21/2023)   Exercise Vital Sign    Days of Exercise per Week: 0 days    Minutes of Exercise per Session: Not on file  Stress: No Stress Concern Present (05/21/2023)   [employer] of Occupational Health - Occupational Stress Questionnaire    Feeling of Stress : Not at all  Social Connections: Socially Integrated (05/21/2023)   Social Connection and Isolation Panel [NHANES]    Frequency of Communication with Friends and Family: More than three times a week    Frequency of Social Gatherings with Friends and Family: Twice a week    Attends Religious Services: More than 4 times per year    Active Member of [employer] or Organizations: Yes    Attends Engineer, structural: More than 4 times per year    Marital Status: Married  Catering manager Violence: Not on file    Outpatient Medications Prior to Visit  Medication Sig Dispense  Refill   cholecalciferol (VITAMIN D3) 25 MCG (1000 UNIT) tablet Take 1,000 Units by mouth daily.     meloxicam (MOBIC) 7.5 MG tablet Take 1 tablet (7.5 mg total) by mouth daily. 14 tablet 0   No facility-administered medications prior to visit.    No Known Allergies  Review of Systems  Musculoskeletal:  Positive for joint pain (left shoulder pain).       Objective:    Physical Exam Constitutional:      Appearance: Normal appearance.  HENT:     Head: Normocephalic and atraumatic.  Eyes:     Extraocular Movements: Extraocular movements intact.  Cardiovascular:     Rate and Rhythm: Normal rate.  Pulmonary:     Effort: Pulmonary effort is normal.  Musculoskeletal:     Comments: Only able to partially abduct left arm due to shoulder pain   Skin:    Comments: Approximately 1 cm wide area skin induration, left groin    Neurological:     Mental Status: She is alert.  Psychiatric:        Mood and Affect: Mood normal.        Behavior: Behavior normal.        Thought Content: Thought content normal.        Judgment: Judgment normal.     BP 122/60 (BP Location: Right Arm, Patient Position: Sitting, Cuff Size: Small)   Pulse 67   Temp 98.2 F (36.8 C) (Oral)   Resp 16   Wt 134 lb (60.8 kg)   LMP 06/26/2010   SpO2 100%   BMI 25.32 kg/m  Wt Readings from Last 3 Encounters:  05/27/23 134 lb (60.8 kg)  09/03/22 126 lb 9.6 oz (57.4 kg)  02/19/22 121 lb (54.9 kg)       Assessment & Plan:  Chronic left shoulder pain Assessment & Plan: No improvement with PT. Has been going on for 1 year.  Will refer to ortho for further evaluation.   Orders: -     Ambulatory referral to Orthopedics  Folliculitis Assessment & Plan: New.  Seems to be improving. No fluctuance.  Will rx with keflex.  Pt is advised to let me know if symptoms worsen or if area does not continue to improve.    Other orders -     Cephalexin; Take 1 capsule (500 mg total) by mouth 3 (three) times daily.   Dispense: 21 capsule; Refill: 0     I,Rachel  Rivera,acting as a Neurosurgeon for Lemont Fillers, NP.,have documented all relevant documentation on the behalf of Lemont Fillers, NP,as directed by  Lemont Fillers, NP while in the presence of Lemont Fillers, NP.   I, Lemont Fillers, NP, personally preformed the services described in this documentation.  All medical record entries made by the scribe were at my direction and in my presence.  I have reviewed the chart and discharge instructions (if applicable) and agree that the record reflects my personal performance and is accurate and complete. 05/27/23   Lemont Fillers, NP

## 2023-05-27 ENCOUNTER — Ambulatory Visit: Payer: 59 | Admitting: Family

## 2023-05-27 VITALS — BP 122/60 | HR 67 | Temp 98.2°F | Resp 16 | Wt 134.0 lb

## 2023-05-27 DIAGNOSIS — M25512 Pain in left shoulder: Secondary | ICD-10-CM | POA: Diagnosis not present

## 2023-05-27 DIAGNOSIS — G8929 Other chronic pain: Secondary | ICD-10-CM | POA: Diagnosis not present

## 2023-05-27 DIAGNOSIS — L739 Follicular disorder, unspecified: Secondary | ICD-10-CM | POA: Diagnosis not present

## 2023-05-27 MED ORDER — CEPHALEXIN 500 MG PO CAPS: 500.0000 mg | ORAL_CAPSULE | Freq: Three times a day (TID) | ORAL | 0 refills | Status: AC

## 2023-05-27 NOTE — Assessment & Plan Note (Signed)
New.  Seems to be improving. No fluctuance.  Will rx with keflex.  Pt is advised to let me know if symptoms worsen or if area does not continue to improve.

## 2023-05-27 NOTE — Assessment & Plan Note (Signed)
No improvement with PT. Has been going on for 1 year.  Will refer to ortho for further evaluation.
